# Patient Record
Sex: Female | Born: 1990 | Race: Black or African American | Hispanic: No | Marital: Married | Smoking: Never smoker
Health system: Southern US, Community
[De-identification: ages and names within clinical notes are randomized; demographics above are authoritative.]

## PROBLEM LIST (undated history)

## (undated) ENCOUNTER — Inpatient Hospital Stay (HOSPITAL_COMMUNITY): Payer: Self-pay

## (undated) DIAGNOSIS — G510 Bell's palsy: Secondary | ICD-10-CM

## (undated) DIAGNOSIS — N736 Female pelvic peritoneal adhesions (postinfective): Secondary | ICD-10-CM

## (undated) HISTORY — DX: Bell's palsy: G51.0

## (undated) HISTORY — DX: Female pelvic peritoneal adhesions (postinfective): N73.6

## (undated) HISTORY — PX: WISDOM TOOTH EXTRACTION: SHX21

---

## 2013-10-06 ENCOUNTER — Ambulatory Visit: Payer: BC Managed Care – PPO | Admitting: Family Medicine

## 2013-10-06 VITALS — BP 116/68 | HR 88 | Temp 99.1°F | Resp 20 | Ht 61.5 in | Wt 187.6 lb

## 2013-10-06 DIAGNOSIS — I889 Nonspecific lymphadenitis, unspecified: Secondary | ICD-10-CM

## 2013-10-06 DIAGNOSIS — J310 Chronic rhinitis: Secondary | ICD-10-CM

## 2013-10-06 MED ORDER — AMOXICILLIN 875 MG PO TABS: 875.0000 mg | ORAL_TABLET | Freq: Two times a day (BID) | ORAL | Status: DC

## 2013-10-06 NOTE — Patient Instructions (Signed)
Take the antibiotic one twice daily  Return if worse or not improving  Take a Claritin-D or Allegra-D one daily also to try help shrink back the swollen tissues in the nose.

## 2013-10-06 NOTE — Progress Notes (Signed)
Subjective: 22 year old lady who is here with her left side of the nose being swollen and stopped up. It's been getting this way, but today is worse. She does not smoke. She also now has a swollen gland underneath the left side of her chin.  Objective: Nose is swollen shut on the left side. She has a nose piercing on the right but nothing on the left. She has a patent airway on the right. Her neck is supple with a tender node under the left side of the chin. The throat was clear.  Assessment: Rhinitis with secondary cervical adenitis  Plan: Amoxicillin 875 twice daily Claritin-D or Allegra-D Return if not improving

## 2016-04-13 HISTORY — PX: WISDOM TOOTH EXTRACTION: SHX21

## 2016-06-14 ENCOUNTER — Encounter: Payer: Self-pay | Admitting: Obstetrics and Gynecology

## 2016-06-14 ENCOUNTER — Other Ambulatory Visit: Payer: Self-pay | Admitting: Obstetrics and Gynecology

## 2016-06-14 ENCOUNTER — Ambulatory Visit: Payer: 59 | Admitting: Obstetrics and Gynecology

## 2016-06-14 VITALS — BP 100/68 | HR 70 | Resp 16 | Ht 60.5 in | Wt 196.2 lb

## 2016-06-14 DIAGNOSIS — Z01419 Encounter for gynecological examination (general) (routine) without abnormal findings: Secondary | ICD-10-CM

## 2016-06-14 DIAGNOSIS — Z0000 Encounter for general adult medical examination without abnormal findings: Secondary | ICD-10-CM

## 2016-06-14 DIAGNOSIS — N926 Irregular menstruation, unspecified: Secondary | ICD-10-CM | POA: Diagnosis not present

## 2016-06-14 DIAGNOSIS — E669 Obesity, unspecified: Secondary | ICD-10-CM

## 2016-06-14 LAB — CBC
HEMATOCRIT: 38 % (ref 35.0–45.0)
HEMOGLOBIN: 12.7 g/dL (ref 11.7–15.5)
MCH: 27.1 pg (ref 27.0–33.0)
MCHC: 33.4 g/dL (ref 32.0–36.0)
MCV: 81 fL (ref 80.0–100.0)
MPV: 10.3 fL (ref 7.5–12.5)
Platelets: 339 10*3/uL (ref 140–400)
RBC: 4.69 MIL/uL (ref 3.80–5.10)
RDW: 14.1 % (ref 11.0–15.0)
WBC: 6.1 10*3/uL (ref 3.8–10.8)

## 2016-06-14 LAB — POCT URINALYSIS DIPSTICK
BILIRUBIN UA: NEGATIVE
GLUCOSE UA: NEGATIVE
Ketones, UA: NEGATIVE
Leukocytes, UA: NEGATIVE
Nitrite, UA: NEGATIVE
Protein, UA: NEGATIVE
RBC UA: NEGATIVE
UROBILINOGEN UA: NEGATIVE
pH, UA: 5

## 2016-06-14 LAB — POCT URINE PREGNANCY: Preg Test, Ur: NEGATIVE

## 2016-06-14 LAB — HCG, QUANTITATIVE, PREGNANCY: hCG, Beta Chain, Quant, S: <2 m[IU]/mL

## 2016-06-14 LAB — TSH: TSH: 1.19 mIU/L

## 2016-06-14 NOTE — Progress Notes (Signed)
25 y.o. G0P0000 Married [place] American female here for annual exam.    Wants to plan for healthy pregnancy.  Menses - June 23-27.  July was late by a couple of days and it was late. Thought she had symptoms of pregnancy - breast tenderness, headache and had LLQ pain for 2 weeks and then did a UPT on 06/06/16, which was negative.   Did another UPT on 06/10/16 which was negative.  Wants to loose weight in preparation for upcoming pregnancy.   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   PCP:  None  UPT:  Negative  Patient's last menstrual period was 06/06/2016 (exact date).     Period Cycle (Days): 30 Period Pattern: Regular Menstrual Flow: Moderate Menstrual Control: Maxi pad Menstrual Control Change Freq (Hours): every 2 hours for cleanliness Dysmenorrhea: (!) Mild (first day severe then mild)     Sexually active: Yes.   Preference:  Female partner. The current method of family planning is none.    Exercising: No.   Smoker:  no  Health Maintenance: Pap: ? 2015 normal per patient History of abnormal Pap:  no MMG:  n/a Colonoscopy:  n/a BMD:   n/a  Result  n/a TDaP: UNSURE.Marland Kitchen She will check.   Gardasil:   No.  Declines this.  Screening Labs:  Hb today: 12.7, Urine today: Neg   reports that she has never smoked. She has never used smokeless tobacco. She reports that she drinks about 0.6 oz of alcohol per week . She reports that she does not use drugs.  Past Medical History:  Diagnosis Date  . Bell's palsy    3-4 years ago    Past Surgical History:  Procedure Laterality Date  . WISDOM TOOTH EXTRACTION  04/2016    Current Outpatient Prescriptions  Medication Sig Dispense Refill  . OVER THE COUNTER MEDICATION Vitafusion Vitamins -- Takes 1 tablet daily     No current facility-administered medications for this visit.     Family History  Problem Relation Age of Onset  . Hypertension Father   .  Cancer Maternal Grandmother     stomach    ROS:  Pertinent items are noted in HPI.  Otherwise, a comprehensive ROS was negative.  Exam:   BP 100/68 (BP Location: Right Arm, Patient Position: Sitting, Cuff Size: Large)   Pulse 70   Resp 16   Ht 5' 0.5" (1.537 m)   Wt 196 lb 3.2 oz (89 kg)   LMP 06/06/2016 (Exact Date)   BMI 37.69 kg/m     General appearance: alert, cooperative and appears stated age Head: Normocephalic, without obvious abnormality, atraumatic Neck: no adenopathy, supple, symmetrical, trachea midline and thyroid normal to inspection and palpation Lungs: clear to auscultation bilaterally Breasts: normal appearance, no masses or tenderness, No nipple retraction or dimpling, No nipple discharge or bleeding, No axillary or supraclavicular adenopathy Heart: regular rate and rhythm Abdomen: soft, non-tender; no masses, no organomegaly Extremities: extremities normal, atraumatic, no cyanosis or edema Skin: Skin color, texture, turgor normal. No rashes or lesions Lymph nodes: Cervical, supraclavicular, and axillary nodes normal. No abnormal inguinal nodes palpated Neurologic: Grossly normal  Pelvic: External genitalia:  no lesions              Urethra:  normal appearing urethra with no masses, tenderness or lesions              Bartholins and Skenes: normal  Vagina: normal appearing vagina with normal color and discharge, no lesions              Cervix: no lesions              Pap taken: Yes.   Bimanual Exam:  Uterus:  normal size, contour, position, consistency, mobility, non-tender              Adnexa: no mass, fullness, tenderness             Chaperone was present for exam.  Assessment:   Well woman visit with normal exam. Desire for pregnancy.   Plan: Yearly mammogram recommended after age 74.  Recommended self breast exam.  Pap and HR HPV as above. Start PNV. Referral to nutrition counseling.  Discussed weight loss. Routine labs and will  check quant beta hCG due to her abnormal last period.  Discussed timed intercourse using ovulation kits. I discussed reading material for pregnancy preparation.  What to expect When...Marland KitchenMarland Kitchen series.  Follow up annually and prn.   After visit summary provided.

## 2016-06-14 NOTE — Patient Instructions (Signed)
Health Maintenance, Female Adopting a healthy lifestyle and getting preventive care can go a long way to promote health and wellness. Talk with your health care provider about what schedule of regular examinations is right for you. This is a good chance for you to check in with your provider about disease prevention and staying healthy. In between checkups, there are plenty of things you can do on your own. Experts have done a lot of research about which lifestyle changes and preventive measures are most likely to keep you healthy. Ask your health care provider for more information. WEIGHT AND DIET  Eat a healthy diet  Be sure to include plenty of vegetables, fruits, low-fat dairy products, and lean protein.  Do not eat a lot of foods high in solid fats, added sugars, or salt.  Get regular exercise. This is one of the most important things you can do for your health.  Most adults should exercise for at least 150 minutes each week. The exercise should increase your heart rate and make you sweat (moderate-intensity exercise).  Most adults should also do strengthening exercises at least twice a week. This is in addition to the moderate-intensity exercise.  Maintain a healthy weight  Body mass index (BMI) is a measurement that can be used to identify possible weight problems. It estimates body fat based on height and weight. Your health care provider can help determine your BMI and help you achieve or maintain a healthy weight.  For females 20 years of age and older:   A BMI below 18.5 is considered underweight.  A BMI of 18.5 to 24.9 is normal.  A BMI of 25 to 29.9 is considered overweight.  A BMI of 30 and above is considered obese.  Watch levels of cholesterol and blood lipids  You should start having your blood tested for lipids and cholesterol at 26 years of age, then have this test every 5 years.  You may need to have your cholesterol levels checked more often if:  Your lipid  or cholesterol levels are high.  You are older than 26 years of age.  You are at high risk for heart disease.  CANCER SCREENING   Lung Cancer  Lung cancer screening is recommended for adults 55-80 years old who are at high risk for lung cancer because of a history of smoking.  A yearly low-dose CT scan of the lungs is recommended for people who:  Currently smoke.  Have quit within the past 15 years.  Have at least a 30-pack-year history of smoking. A pack year is smoking an average of one pack of cigarettes a day for 1 year.  Yearly screening should continue until it has been 15 years since you quit.  Yearly screening should stop if you develop a health problem that would prevent you from having lung cancer treatment.  Breast Cancer  Practice breast self-awareness. This means understanding how your breasts normally appear and feel.  It also means doing regular breast self-exams. Let your health care provider know about any changes, no matter how small.  If you are in your 20s or 30s, you should have a clinical breast exam (CBE) by a health care provider every 1-3 years as part of a regular health exam.  If you are 40 or older, have a CBE every year. Also consider having a breast X-ray (mammogram) every year.  If you have a family history of breast cancer, talk to your health care provider about genetic screening.  If you   are at high risk for breast cancer, talk to your health care provider about having an MRI and a mammogram every year.  Breast cancer gene (BRCA) assessment is recommended for women who have family members with BRCA-related cancers. BRCA-related cancers include:  Breast.  Ovarian.  Tubal.  Peritoneal cancers.  Results of the assessment will determine the need for genetic counseling and BRCA1 and BRCA2 testing. Cervical Cancer Your health care provider may recommend that you be screened regularly for cancer of the pelvic organs (ovaries, uterus, and  vagina). This screening involves a pelvic examination, including checking for microscopic changes to the surface of your cervix (Pap test). You may be encouraged to have this screening done every 3 years, beginning at age 21.  For women ages 30-65, health care providers may recommend pelvic exams and Pap testing every 3 years, or they may recommend the Pap and pelvic exam, combined with testing for human papilloma virus (HPV), every 5 years. Some types of HPV increase your risk of cervical cancer. Testing for HPV may also be done on women of any age with unclear Pap test results.  Other health care providers may not recommend any screening for nonpregnant women who are considered low risk for pelvic cancer and who do not have symptoms. Ask your health care provider if a screening pelvic exam is right for you.  If you have had past treatment for cervical cancer or a condition that could lead to cancer, you need Pap tests and screening for cancer for at least 20 years after your treatment. If Pap tests have been discontinued, your risk factors (such as having a new sexual partner) need to be reassessed to determine if screening should resume. Some women have medical problems that increase the chance of getting cervical cancer. In these cases, your health care provider may recommend more frequent screening and Pap tests. Colorectal Cancer  This type of cancer can be detected and often prevented.  Routine colorectal cancer screening usually begins at 26 years of age and continues through 26 years of age.  Your health care provider may recommend screening at an earlier age if you have risk factors for colon cancer.  Your health care provider may also recommend using home test kits to check for hidden blood in the stool.  A small camera at the end of a tube can be used to examine your colon directly (sigmoidoscopy or colonoscopy). This is done to check for the earliest forms of colorectal  cancer.  Routine screening usually begins at age 50.  Direct examination of the colon should be repeated every 5-10 years through 26 years of age. However, you may need to be screened more often if early forms of precancerous polyps or small growths are found. Skin Cancer  Check your skin from head to toe regularly.  Tell your health care provider about any new moles or changes in moles, especially if there is a change in a mole's shape or color.  Also tell your health care provider if you have a mole that is larger than the size of a pencil eraser.  Always use sunscreen. Apply sunscreen liberally and repeatedly throughout the day.  Protect yourself by wearing long sleeves, pants, a wide-brimmed hat, and sunglasses whenever you are outside. HEART DISEASE, DIABETES, AND HIGH BLOOD PRESSURE   High blood pressure causes heart disease and increases the risk of stroke. High blood pressure is more likely to develop in:  People who have blood pressure in the high end   of the normal range (130-139/85-89 mm Hg).  People who are overweight or obese.  People who are African American.  If you are 38-23 years of age, have your blood pressure checked every 3-5 years. If you are 61 years of age or older, have your blood pressure checked every year. You should have your blood pressure measured twice--once when you are at a hospital or clinic, and once when you are not at a hospital or clinic. Record the average of the two measurements. To check your blood pressure when you are not at a hospital or clinic, you can use:  An automated blood pressure machine at a pharmacy.  A home blood pressure monitor.  If you are between 45 years and 39 years old, ask your health care provider if you should take aspirin to prevent strokes.  Have regular diabetes screenings. This involves taking a blood sample to check your fasting blood sugar level.  If you are at a normal weight and have a low risk for diabetes,  have this test once every three years after 26 years of age.  If you are overweight and have a high risk for diabetes, consider being tested at a younger age or more often. PREVENTING INFECTION  Hepatitis B  If you have a higher risk for hepatitis B, you should be screened for this virus. You are considered at high risk for hepatitis B if:  You were born in a country where hepatitis B is common. Ask your health care provider which countries are considered high risk.  Your parents were born in a high-risk country, and you have not been immunized against hepatitis B (hepatitis B vaccine).  You have HIV or AIDS.  You use needles to inject street drugs.  You live with someone who has hepatitis B.  You have had sex with someone who has hepatitis B.  You get hemodialysis treatment.  You take certain medicines for conditions, including cancer, organ transplantation, and autoimmune conditions. Hepatitis C  Blood testing is recommended for:  Everyone born from [birth year] through 1966.  Anyone with known risk factors for hepatitis C. Sexually transmitted infections (STIs)  You should be screened for sexually transmitted infections (STIs) including gonorrhea and chlamydia if:  You are sexually active and are younger than 26 years of age.  You are older than 26 years of age and your health care provider tells you that you are at risk for this type of infection.  Your sexual activity has changed since you were last screened and you are at an increased risk for chlamydia or gonorrhea. Ask your health care provider if you are at risk.  If you do not have HIV, but are at risk, it may be recommended that you take a prescription medicine daily to prevent HIV infection. This is called pre-exposure prophylaxis (PrEP). You are considered at risk if:  You are sexually active and do not regularly use condoms or know the HIV status of your partner(s).  You take drugs by injection.  You are sexually  active with a partner who has HIV. Talk with your health care provider about whether you are at high risk of being infected with HIV. If you choose to begin PrEP, you should first be tested for HIV. You should then be tested every 3 months for as long as you are taking PrEP.  PREGNANCY   If you are premenopausal and you may become pregnant, ask your health care provider about preconception counseling.  If you may  become pregnant, take 400 to 800 micrograms (mcg) of folic acid every day.  If you want to prevent pregnancy, talk to your health care provider about birth control (contraception). OSTEOPOROSIS AND MENOPAUSE   Osteoporosis is a disease in which the bones lose minerals and strength with aging. This can result in serious bone fractures. Your risk for osteoporosis can be identified using a bone density scan.  If you are 61 years of age or older, or if you are at risk for osteoporosis and fractures, ask your health care provider if you should be screened.  Ask your health care provider whether you should take a calcium or vitamin D supplement to lower your risk for osteoporosis.  Menopause may have certain physical symptoms and risks.  Hormone replacement therapy may reduce some of these symptoms and risks. Talk to your health care provider about whether hormone replacement therapy is right for you.  HOME CARE INSTRUCTIONS   Schedule regular health, dental, and eye exams.  Stay current with your immunizations.   Do not use any tobacco products including cigarettes, chewing tobacco, or electronic cigarettes.  If you are pregnant, do not drink alcohol.  If you are breastfeeding, limit how much and how often you drink alcohol.  Limit alcohol intake to no more than 1 drink per day for nonpregnant women. One drink equals 12 ounces of beer, 5 ounces of wine, or 1 ounces of hard liquor.  Do not use street drugs.  Do not share needles.  Ask your health care provider for help if  you need support or information about quitting drugs.  Tell your health care provider if you often feel depressed.  Tell your health care provider if you have ever been abused or do not feel safe at home.   This information is not intended to replace advice given to you by your health care provider. Make sure you discuss any questions you have with your health care provider.   Document Released: 05/15/2011 Document Revised: 11/20/2014 Document Reviewed: 10/01/2013 Elsevier Interactive Patient Education [employer].

## 2016-06-15 LAB — LIPID PANEL
Cholesterol: 136 mg/dL (ref 125–200)
HDL: 42 mg/dL — ABNORMAL LOW (ref >=46)
LDL Cholesterol: 70 mg/dL (ref <130)
Total CHOL/HDL Ratio: 3.2 Ratio (ref <=5.0)
Triglycerides: 121 mg/dL (ref <150)
VLDL: 24 mg/dL (ref <30)

## 2016-06-15 LAB — COMPREHENSIVE METABOLIC PANEL
ALBUMIN: 4 g/dL (ref 3.6–5.1)
ALT: 10 U/L (ref 6–29)
AST: 13 U/L (ref 10–30)
Alkaline Phosphatase: 56 U/L (ref 33–115)
BUN: 11 mg/dL (ref 7–25)
CHLORIDE: 102 mmol/L (ref 98–110)
CO2: 28 mmol/L (ref 20–31)
Calcium: 9.3 mg/dL (ref 8.6–10.2)
Creat: 0.77 mg/dL (ref 0.50–1.10)
Glucose, Bld: 110 mg/dL — ABNORMAL HIGH (ref 65–99)
Potassium: 3.9 mmol/L (ref 3.5–5.3)
Sodium: 140 mmol/L (ref 135–146)
TOTAL PROTEIN: 7.2 g/dL (ref 6.1–8.1)
Total Bilirubin: 0.4 mg/dL (ref 0.2–1.2)

## 2016-06-15 LAB — HEMOGLOBIN, FINGERSTICK: HEMOGLOBIN, FINGERSTICK: 12.7 g/dL (ref 12.0–16.0)

## 2016-06-15 LAB — IPS PAP TEST WITH REFLEX TO HPV

## 2016-06-16 ENCOUNTER — Ambulatory Visit: Payer: 59 | Admitting: Obstetrics and Gynecology

## 2016-06-20 LAB — HEMOGLOBIN A1C
HEMOGLOBIN A1C: 4.3 % (ref <5.7)
Mean Plasma Glucose: 77 mg/dL

## 2016-07-24 ENCOUNTER — Telehealth: Payer: Self-pay | Admitting: Obstetrics and Gynecology

## 2016-07-24 NOTE — Telephone Encounter (Signed)
Patient calling with questions about her cycle and any lab tests she may need.

## 2016-07-25 NOTE — Telephone Encounter (Signed)
Spoke with patient. Patient menstrual cycle due to start on Aug 26th. Patient took UPT on 07/12/16 and 07/22/2016, patient reports they were both negative. Cycle started 07/22/16 with light spotting. On 07/23/16 and 07/24/16, patient reports passing increased clots with little bleeding. Patient states she is having light bleeding today that is dark brown in color and is still passing clots. Reports her cycles usually last 5 days, but feels her cycle is about to end, which is not normal per patient. Patient is not on birth control. States she was experiencing left lower quadrant pain prior to her appointment with Dr. Edward JollySilva on 06/14/16. Reports that she discussed this with Dr. Edward JollySilva, but the pain had subsided at the time of her appointment. Patient reports intermittent left upper quadrant pain with the start of her menses on 07/22/16, but denies any current pain. Advised patient she will need to be seen in office for further evaluation. Patient is agreeable and is requesting an appointment after 1:30pm. Advised patient will review scheduling with nursing supervisor and will return call. Patient is agreeable.

## 2016-07-25 NOTE — Telephone Encounter (Signed)
Left message to call Kaitlyn at [phone]. 

## 2016-07-25 NOTE — Telephone Encounter (Signed)
Spoke with patient. Advised I have spoken with nursing supervisor Billie RuddySally Yeakley, RN. Offered appointment for today after 1:30 pm with Dr.Jertson as Dr.Silva is out of the office today. Patient declines. Offered appointment with NP after 1:30 pm on 9/13 or 9/14, but patient declines. Patient spoke with her employer and may be seen for a morning appointment with Dr.Silva. Appointment scheduled for 07/27/2016 at 11:30 am with Dr.Silva. Patient is agreeable to date and time. Advised if her bleeding increases to having to change her pad/tampon every hour due to bleeding through for 2 hours or develops any new symptoms she will need to be seen for immediate evaluation with our office or ER. Patient is agreeable and verbalizes understanding.  Routing to provider for final review. Patient agreeable to disposition. Will close encounter.

## 2016-07-27 ENCOUNTER — Ambulatory Visit (INDEPENDENT_AMBULATORY_CARE_PROVIDER_SITE_OTHER): Payer: 59 | Admitting: Obstetrics and Gynecology

## 2016-07-27 ENCOUNTER — Encounter: Payer: Self-pay | Admitting: Obstetrics and Gynecology

## 2016-07-27 VITALS — BP 122/70 | HR 84 | Ht 60.5 in | Wt 196.4 lb

## 2016-07-27 DIAGNOSIS — N926 Irregular menstruation, unspecified: Secondary | ICD-10-CM

## 2016-07-27 DIAGNOSIS — N979 Female infertility, unspecified: Secondary | ICD-10-CM | POA: Diagnosis not present

## 2016-07-27 LAB — HEMOGLOBIN, FINGERSTICK: Hemoglobin, fingerstick: 12.4 g/dL (ref 12.0–16.0)

## 2016-07-27 LAB — POCT URINE PREGNANCY: Preg Test, Ur: NEGATIVE

## 2016-07-27 NOTE — Progress Notes (Signed)
GYNECOLOGY  VISIT   HPI: 25 y.o.   Single  African American  female   Kristine Rivera with Patient's last menstrual period was 07/22/2016 (exact date).   here for heavy irregular bleeding with clots. Patient states this last cycles was 15 days late. At heaviest had two days of heavy clotting into the toilet. Changed pad twice an hour for freshness reasons but not due to heaviness of flow.   For her usual cycles changes pad hourly as she does not like to have blood there.  It is not full of blood.  Menses 3 late in July.   Had a negative UPT at home.  Headache.  No dizziiness or lightheadedness.  Fleeting pain in left side. Feels a knot in skin.   Used birth control the last time in May 2010.   Last time she prevented pregnancy was in early 2016.   UPT:Neg HGB: 12.4   GYNECOLOGIC HISTORY: Patient's last menstrual period was 07/22/2016 (exact date). Contraception:  none Menopausal hormone therapy:  n/a Last mammogram:  n/a Last pap smear:   06-14-16 Neg        OB History    Gravida Para Term Preterm AB Living   0 0 0 0 0 0   SAB TAB Ectopic Multiple Live Births   0 0 0 0 0         There are no active problems to display for this patient.   Past Medical History:  Diagnosis Date  . Bell's palsy    3-4 years ago    Past Surgical History:  Procedure Laterality Date  . WISDOM TOOTH EXTRACTION  04/2016    Current Outpatient Prescriptions  Medication Sig Dispense Refill  . Prenatal Vit-Fe Fumarate-FA (PRENATAL VITAMIN PO) Take 1 tablet by mouth daily.     No current facility-administered medications for this visit.      ALLERGIES: Review of patient's allergies indicates no known allergies.  Family History  Problem Relation Age of Onset  . Hypertension Father   . Cancer Maternal Grandmother     stomach    Social History   Social History  . Marital status: Single    Spouse name: N/A  . Number of children: N/A  . Years of education: N/A   Occupational  History  . Not on file.   Social History Main Topics  . Smoking status: Never Smoker  . Smokeless tobacco: Never Used  . Alcohol use 0.6 oz/week    1 Standard drinks or equivalent per week  . Drug use: No  . Sexual activity: Yes    Partners: Male    Birth control/ protection: None   Other Topics Concern  . Not on file   Social History Narrative  . No narrative on file    ROS:  Pertinent items are noted in HPI.  PHYSICAL EXAMINATION:    BP 122/70 (BP Location: Right Arm, Patient Position: Sitting, Cuff Size: Large)   Pulse 84   Ht 5' 0.5" (1.537 m)   Wt 196 lb 6.4 oz (89.1 kg)   LMP 07/22/2016 (Exact Date)   BMI 37.73 kg/m     General appearance: alert, cooperative and appears stated age   Abdomen: soft, slightly tender left mid abdomen, no definitive masses,  no organomegaly    Pelvic: External genitalia:  no lesions              Urethra:  normal appearing urethra with no masses, tenderness or lesions  Bartholins and Skenes: normal                 Vagina: normal appearing vagina with normal color and discharge, no lesions              Cervix: no lesions                Bimanual Exam:  Uterus:  normal size, contour, position, consistency, mobility, non-tender              Adnexa: no mass, fullness, tenderness           Chaperone was present for exam.  ASSESSMENT  Irregular menses.  Primary infertility.  PLAN  Discussion of irregular menses and menorrrhagia. Spouse will do SA - Zachery Conch at [employer].  Rx given to patient along with hand written Rx.  Return for progeserone on Sept 29, day 21.  Patient also has ovulation kit at home.  She was instructed in use.  [facility] at [facility].  May need pelvic ultrasound if heavy cycles persist.  Continue PNV.    An After Visit Summary was printed and given to the patient.  __25____ minutes face to face time of which over 50% was spent in counseling.

## 2016-08-02 ENCOUNTER — Encounter: Payer: 59 | Attending: Obstetrics and Gynecology | Admitting: Dietician

## 2016-08-02 DIAGNOSIS — E669 Obesity, unspecified: Secondary | ICD-10-CM | POA: Insufficient documentation

## 2016-08-02 DIAGNOSIS — Z713 Dietary counseling and surveillance: Secondary | ICD-10-CM | POA: Insufficient documentation

## 2016-08-02 NOTE — Patient Instructions (Addendum)
Check out [employer] on [address] [phone]. Try non fat plain greek yogurt to make ranch dressing or measure out 2 tablespoon of "real ranch". Aim to fill half of your plate with vegetables lunch and dinner if possible. Have about 3-4 oz of protein with meals. Try eating meals or snacks with no TV.  If you are hungry, have a snack with carbs and protein (yogurt, fruit with nuts or cheese, rice cake with peanut butter) Try using smaller plates or bowls to help with serving size. Portion out nuts (small handful).  Try putting milk in cereal bowl before cereal.  Plan to walk with husband on weekends.  Plan for one sweet treat per treat.

## 2016-08-02 NOTE — Progress Notes (Signed)
  Medical Nutrition Therapy:  Appt start time: 1605 end time:  1710.   Assessment:  Primary concerns today: Kristine Rivera is here today since trying to lose weight before getting pregnant. Weight had been going up over time (about 25 lbs in past 6 years). Has tried Herbalife on 07/23/2016 and has lost about 3 lbs since then. Feeling like she needs lunch ideas since she is getting bored and now getting back into bad habits. Started to exercise last night and pulled a muscle. Signed up for [employer] through work. Trying to "drink half of her body weight in water". Feels like she is eating too many snacks.  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 Lives with husband. Does not miss or skip meals. Was eating all meals from restaurants and loves sweets (ice cream) before two weeks ago. Feels tired after work and has a hard time cooking at night. Weekends both her and husband will cook. Husband does not want to help to cook during the week. Did meal prep for the first week of Herbalife.   Likes to do exercise with a partner and doesn't have one at this point. Feels like she eats more because it's there then out of hunger.   Vegetables she like: lettuce, green beans, carrots, broccoli, collard greens, cabbage   Preferred Learning Style:   No preference indicated   Learning Readiness:   Ready  MEDICATIONS: none   DIETARY INTAKE:  Usual eating pattern includes 3 meals and 5 snacks per day.  Avoided foods include a lot of vegetables (hasn't tried them), cantaloupe, onions, tomatoes   24-hr recall:  B ( AM): Herbalife Shake (was skipping) Snk ( AM): none L ( PM): yogurt, grilled chicken with brown rice and salad or Malawiturkey wrap with lettuce and mozzarella cheese (was doing frozen banquet meals or sandwich or leftovers) Snk ( PM): yogurt, cheese D ( PM): Herbalife Shake or 2 bowls of honey nut oat cereal with milk Snk ( PM): mixed nuts, greek yogurt, protein bars Beverages: water  Usual  physical activity: just started last night, likes to walk   Estimated energy needs: 1800 calories 200 g carbohydrates 135 g protein 50 g fat  Progress Towards Goal(s):  In progress.   Nutritional Diagnosis:  [place]-3.3 Overweight/obesity As related to hx of excessive snacking and large portion sizes.  As evidenced by BMI of 38.6.    Intervention:  Nutrition counseling provided. Plan: Check out [employer] on [address] [phone]. Try non fat plain greek yogurt to make ranch dressing or measure out 2 tablespoon of "real ranch". Aim to fill half of your plate with vegetables lunch and dinner if possible. Have about 3-4 oz of protein with meals. Try eating meals or snacks with no TV.  If you are hungry, have a snack with carbs and protein (yogurt, fruit with nuts or cheese, rice cake with peanut butter) Try using smaller plates or bowls to help with serving size. Portion out nuts (small handful).  Try putting milk in cereal bowl before cereal.  Plan to walk with husband on weekends.  Plan for one sweet treat per treat.   Teaching Method Utilized:  Visual Auditory Hands on  Handouts given during visit include:  MyPlate handout  15 g CHO Snacks  Meal Card  Barriers to learning/adherence to lifestyle change: none  Demonstrated degree of understanding via:  Teach Back   Monitoring/Evaluation:  Dietary intake, exercise, and body weight in 1 month(s).

## 2016-08-08 ENCOUNTER — Telehealth: Payer: Self-pay | Admitting: Obstetrics and Gynecology

## 2016-08-08 NOTE — Telephone Encounter (Signed)
I have reviewed the phone calls, and I need to help clarify ovulation testing for the patient. There are 2 commonly used reliable methods to detect ovulation. - Ovulation predictor kits of the urine test for the luteinizing hormone surge that occurs about 36 hours before ovulation actually occurs.  Women can do this test in the privacy of their own home.  - Progesterone measurement of the blood tests for progesterone hormone level in the blood that rises after ovulation has occurred.  This means that this is a retrospective test.  This is probably best measured about 7 days post ovulation.    Originally we calculated that we thought that her progesterone hormone would be measured on Sept. 29 based on the first day of her last period.   Now that she has ovulation predictor kit testing suggesting ovulation currently, October 2 is the better day to measure the progesterone.  All this means is that her cycles are not always 28 days long, which we already knew.

## 2016-08-08 NOTE — Telephone Encounter (Signed)
Patient calling for update on phone encounter as seen below. Advised will review with covering provider and return call. Patient agreeable.   Dr. Hyacinth MeekerMiller, please advise?  CC: Dr. Edward JollySilva

## 2016-08-08 NOTE — Telephone Encounter (Signed)
If she is correct and today is ovulation, then should have progesterone level done 7 days later.  That would be Oct 2 by my calculation.  Please confirm.  Ok to change lab date.  Will CC Dr. Edward JollySilva.

## 2016-08-08 NOTE — Telephone Encounter (Signed)
Patient has been doing the ovulation test and today is her peak. She wants to know if she will need to come in today instead of Friday for her lab.

## 2016-08-08 NOTE — Telephone Encounter (Signed)
Spoke with patient. Advised of Dr. Sabra Heck, covering provider, recommendations of having lab drawn on 08/14/16. Patient states will be too late to do the test on 08/14/16,  she reports she will not be ovulating. Patient states LMP started 07/31/16 and is using a Clear Blue Predictor Test.Patient states she will need "ovulation" lab drawn within 48 hours of peak ovulation according to package insert in predictor kit. Advised patient would place on hold and review with Dr. Sabra Heck. Placed patient on hold, spoke with Dr. Sabra Heck. Dr. Sabra Heck recommended 08/14/16 lab draw, but states patient has the option to have lab drawn today, refrigerate and review with Dr. Quincy Simmonds 08/09/16 for confirmation, so that patient does not miss window of opportunity. Returned to patient, advised patient of Dr. Ammie Ferrier recommendations. Patient states at this time her LMP started 07/22/16. Patient states she would like to wait and have Dr. Quincy Simmonds review notes for recommendations. I advised would send to Dr. Sabra Heck and Dr. Quincy Simmonds and follow-up with patient in the morning, 08/09/16.   Dr. Quincy Simmonds, would you like patient to come in earlier for lab appointment or come in earlier? Patient currently scheduled for Progesterone Lab draw 08/11/16.   CC: Dr. Sabra Heck

## 2016-08-08 NOTE — Telephone Encounter (Signed)
Spoke with patient. Patient states per last OV 07/27/16, patient reports taking ovulation test today. Patient reports today is her peak. Patient sates scheduled for Labs 08/11/16 for ovulation and progesterone. Patient would like to know if she should come today instead of Friday due to ovulation test reading. Advised patient would review with Dr. Edward JollySilva and return call with recommendations. Patient is agreeable.   Dr. Edward JollySilva, please advise?

## 2016-08-09 NOTE — Telephone Encounter (Signed)
Spoke with patient. Advised as seen below per Dr. Edward JollySilva. Patient scheduled for Lab visit 08/14/16 at 3:30pm. Patient verbalizes understanding and is agreeable.   Routing to provider for final review. Patient is agreeable to disposition. Will close encounter.

## 2016-08-11 ENCOUNTER — Other Ambulatory Visit: Payer: 59

## 2016-08-14 ENCOUNTER — Other Ambulatory Visit (INDEPENDENT_AMBULATORY_CARE_PROVIDER_SITE_OTHER): Payer: 59

## 2016-08-14 DIAGNOSIS — N926 Irregular menstruation, unspecified: Secondary | ICD-10-CM

## 2016-08-15 LAB — PROGESTERONE: PROGESTERONE: 19.3 ng/mL

## 2016-08-22 ENCOUNTER — Telehealth: Payer: Self-pay | Admitting: Obstetrics and Gynecology

## 2016-08-22 NOTE — Telephone Encounter (Signed)
Spoke with patient. Patient states she needs letter for work with name, date and time of Lab appt, sent through MyChart, no signature required. Advised will send letter, call for any additional questions.  Routing to provider for final review. Patient is agreeable to disposition. Will close encounter.

## 2016-08-22 NOTE — Telephone Encounter (Signed)
Patient is requesting a letter that she was seen on 08/14/16 for labs.  Patient is wanting this sent through her MyChart

## 2016-08-22 NOTE — Telephone Encounter (Signed)
Thank you for sending this letter! 

## 2016-09-01 ENCOUNTER — Ambulatory Visit (HOSPITAL_COMMUNITY)
Admission: RE | Admit: 2016-09-01 | Discharge: 2016-09-01 | Disposition: A | Discharge status: Home / Self Care | Payer: 59 | Source: Ambulatory Visit | Attending: Obstetrics and Gynecology | Admitting: Obstetrics and Gynecology

## 2016-09-01 DIAGNOSIS — N979 Female infertility, unspecified: Secondary | ICD-10-CM

## 2016-09-01 MED ORDER — IOPAMIDOL (ISOVUE-300) INJECTION 61%
30.0000 mL | Freq: Once | INTRAVENOUS | Status: AC | PRN
  Administered 2016-09-01: 10 mL

## 2016-09-04 ENCOUNTER — Encounter: Payer: Self-pay | Admitting: Obstetrics and Gynecology

## 2016-09-06 ENCOUNTER — Encounter: Payer: 59 | Attending: Obstetrics and Gynecology | Admitting: Dietician

## 2016-09-06 DIAGNOSIS — E669 Obesity, unspecified: Secondary | ICD-10-CM | POA: Diagnosis not present

## 2016-09-06 DIAGNOSIS — Z713 Dietary counseling and surveillance: Secondary | ICD-10-CM | POA: Insufficient documentation

## 2016-09-06 NOTE — Patient Instructions (Addendum)
Try non fat plain greek yogurt to make ranch dressing or measure out 2 tablespoon of "real ranch". Have about 3-4 oz of protein with meals (salmon/fish, chicken, Malawiturkey, eggs, beans). Try eating meals or snacks with no TV.  Try using smaller plates or bowls to help with serving size. Portion out nuts (small handful).  Try putting milk in cereal bowl before cereal.  Plan for one sweet treat per week. Continue exercising 2-3 x week.

## 2016-09-06 NOTE — Progress Notes (Signed)
  Medical Nutrition Therapy:  Appt start time: 1605 end time:  1630.   Assessment:  Primary concerns today: Kristine Rivera is here today since trying to lose weight before getting pregnant. Returns with a 6 lb weight loss in past 4 weeks. Exercising 2 x week. Knee has been hurting a little bit.   Overall has been eating more salad. Has no sweets at home anymore.  Vegetables she like: lettuce, green beans, carrots, broccoli, collard greens, cabbage   Preferred Learning Style:   No preference indicated   Learning Readiness:   Ready  MEDICATIONS: none   DIETARY INTAKE:  Usual eating pattern includes 3 meals and 5 snacks per day.  Avoided foods include a lot of vegetables (hasn't tried them), cantaloupe, onions, tomatoes   24-hr recall:  B ( AM): Herbalife Shake  Snk ( AM): none or rice cake L ( PM): ribs and vegetables with pasta side or salad and yogurt or Malawiturkey wrap with lettuce and mozzarella cheese and sometimes with yogurt Snk ( PM): yogurt, cheese D ( PM): skipping or pizza  Snk ( PM): none Beverages: water, soda rarely  Usual physical activity: running/mixed cardio 2 x week for 60 minutes  Estimated energy needs: 1800 calories 200 g carbohydrates 135 g protein 50 g fat  Progress Towards Goal(s):  In progress.   Nutritional Diagnosis:  [place]-3.3 Overweight/obesity As related to hx of excessive snacking and large portion sizes.  As evidenced by BMI of 38.6.    Intervention:  Nutrition counseling provided. Plan: Try non fat plain greek yogurt to make ranch dressing or measure out 2 tablespoon of "real ranch". Have about 3-4 oz of protein with meals (salmon/fish, chicken, Malawiturkey, eggs, beans). Try eating meals or snacks with no TV.  Try using smaller plates or bowls to help with serving size. Portion out nuts (small handful).  Try putting milk in cereal bowl before cereal.  Plan for one sweet treat per week. Continue exercising 2-3 x week.   Teaching Method Utilized:   Visual Auditory Hands on  Handouts given during visit include:  none  Barriers to learning/adherence to lifestyle change: none  Demonstrated degree of understanding via:  Teach Back   Monitoring/Evaluation:  Dietary intake, exercise, and body weight in 1 month(s).

## 2016-09-19 ENCOUNTER — Telehealth: Payer: Self-pay | Admitting: Obstetrics and Gynecology

## 2016-09-19 NOTE — Telephone Encounter (Signed)
Order to Dr.Silva for review and signature before fax. 

## 2016-09-19 NOTE — Telephone Encounter (Signed)
I am out of the office, and I can sign it tomorrow if you leave it on my desk.

## 2016-09-19 NOTE — Telephone Encounter (Signed)
Patient says spouse is scheduled at [facility] on Friday at 10:00 and they are needing an order faxed for his visit. Spouse name is Kristine Rivera and his dob is 12/27/1984.

## 2016-09-20 NOTE — Telephone Encounter (Signed)
Signed order faxed with cover sheet and confirmation to [facility] at [phone]. Left detailed message at number provided [phone], okay per ROI. Advised order has been faxed for appointment scheduled on 09/22/2016. Advised to return call with any further questions or concerns.  Routing to provider for final review. Patient agreeable to disposition. Will close encounter.

## 2016-09-28 ENCOUNTER — Telehealth: Payer: Self-pay | Admitting: Obstetrics and Gynecology

## 2016-09-28 NOTE — Telephone Encounter (Signed)
Please inform the patient that her partners semen analysis shows low motility and low normal morphology. Dr April MansonYalcinkaya recommends 1000 mg of vit C daily, 400 units of vit E daily, 400 mcg of folic acid daily and 50 mg of zinc daily.  Please set them up with Dr April MansonYalcinkaya for a consultation. The report is being scanned.   Cc: Dr Edward JollySilva

## 2016-09-29 NOTE — Telephone Encounter (Signed)
Patient is returning a call to Jill. °

## 2016-09-29 NOTE — Telephone Encounter (Signed)
Spoke with patient, advised as seen below. Patient verbalizes understanding and is agreeable.  Routing to provider for final review. Patient is agreeable to disposition. Will close encounter.  Cc: Dr. Edward JollySilva

## 2016-09-29 NOTE — Telephone Encounter (Signed)
Left message to call Sigrid Schwebach at [phone].  

## 2016-10-12 ENCOUNTER — Encounter: Payer: 59 | Attending: Obstetrics and Gynecology | Admitting: Dietician

## 2016-10-12 DIAGNOSIS — Z6835 Body mass index (BMI) 35.0-35.9, adult: Secondary | ICD-10-CM

## 2016-10-12 DIAGNOSIS — Z713 Dietary counseling and surveillance: Secondary | ICD-10-CM | POA: Insufficient documentation

## 2016-10-12 DIAGNOSIS — E669 Obesity, unspecified: Secondary | ICD-10-CM

## 2016-10-12 NOTE — Patient Instructions (Addendum)
Get back to meal prepping.  Try non fat plain greek yogurt to make ranch dressing or measure out 2 tablespoon of "real ranch". Have about 3-4 oz of lean protein with meals (salmon/fish, chicken, Malawiturkey, eggs, beans). Consider eating meals or snacks with no TV.  Drink more water and cut back on soda. Try using smaller plates or bowls to help with serving size. Get back to one sweet treat per week. Consider taking a Vitamin D supplement. Continue exercising 1 x week.

## 2016-10-12 NOTE — Progress Notes (Signed)
  Medical Nutrition Therapy:  Appt start time: 1715 end time:  1745   Assessment:  Primary concerns today: Kristine Rivera is here today since she is trying to lose weight. Returns with no weight change. Has another appointment with doctor about trying to pregnancy.  Tried adding milk to the bowl for cereal first and it is helping her eat less. Has not been exercise recently since she is doing a mime practice (Monday and Thursday).  Overall has been eating more salad. Has oatmeal pies and cookies at home.   Vegetables she like: lettuce, green beans, carrots, broccoli, collard greens, cabbage   Preferred Learning Style:   No preference indicated   Learning Readiness:   Ready  MEDICATIONS: none   DIETARY INTAKE:  Usual eating pattern includes 3 meals and 5 snacks per day.  Avoided foods include a lot of vegetables (hasn't tried them), cantaloupe, onions, tomatoes   24-hr recall:  B ( AM): Herbalife Shake  Snk ( AM): none  L ( PM): pizza or Malawiturkey wrap with lettuce and mozzarella cheese and sometimes with yogurt Snk ( PM): yogurt, cheese or cookies/oatmeal pie D ( PM): hamburger helper or thanksgiving food or cereal  Snk ( PM): none  Beverages: water, soda rarely  Usual physical activity: none recently  Estimated energy needs: 1800 calories 200 g carbohydrates 135 g protein 50 g fat  Progress Towards Goal(s):  In progress.   Nutritional Diagnosis:  [place]-3.3 Overweight/obesity As related to hx of excessive snacking and large portion sizes.  As evidenced by BMI of 38.6.    Intervention:  Nutrition counseling provided. Plan: Get back to meal prepping.  Try non fat plain greek yogurt to make ranch dressing or measure out 2 tablespoon of "real ranch". Have about 3-4 oz of lean protein with meals (salmon/fish, chicken, Malawiturkey, eggs, beans). Consider eating meals or snacks with no TV.  Drink more water and cut back on soda. Try using smaller plates or bowls to help with serving  size. Get back to one sweet treat per week. Consider taking a Vitamin D supplement. Continue exercising 1 x week.   Teaching Method Utilized:  Visual Auditory Hands on  Handouts given during visit include:  none  Barriers to learning/adherence to lifestyle change: none  Demonstrated degree of understanding via:  Teach Back   Monitoring/Evaluation:  Dietary intake, exercise, and body weight in 2 month(s).

## 2016-10-18 ENCOUNTER — Encounter: Payer: Self-pay | Admitting: Obstetrics and Gynecology

## 2016-10-18 ENCOUNTER — Ambulatory Visit (INDEPENDENT_AMBULATORY_CARE_PROVIDER_SITE_OTHER): Payer: 59 | Admitting: Obstetrics and Gynecology

## 2016-10-18 VITALS — BP 130/88 | HR 68 | Resp 16 | Ht 59.75 in | Wt 194.0 lb

## 2016-10-18 DIAGNOSIS — R030 Elevated blood-pressure reading, without diagnosis of hypertension: Secondary | ICD-10-CM

## 2016-10-18 DIAGNOSIS — R51 Headache: Secondary | ICD-10-CM | POA: Diagnosis not present

## 2016-10-18 DIAGNOSIS — N979 Female infertility, unspecified: Secondary | ICD-10-CM | POA: Diagnosis not present

## 2016-10-18 DIAGNOSIS — R519 Headache, unspecified: Secondary | ICD-10-CM

## 2016-10-18 MED ORDER — IBUPROFEN 800 MG PO TABS: 800.0000 mg | ORAL_TABLET | Freq: Three times a day (TID) | ORAL | 0 refills | Status: DC | PRN

## 2016-10-18 NOTE — Progress Notes (Signed)
[facility] evaluation of headaches and elevated B/P. Patient scheduled for 10/24/16 at 4pm. Provided patient with address and contact information. Patient is agreeable to date and time.   Spoke with Maggie at Dr. Lyndal RainbowYalcinkaya's office -request referral for faxed to ATTN: Maggie at [phone]. 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 Also include on referral spouse will need sign language interpreter, spouse is deaf. Maggie to return call to patient to schedule appt. Advised patient as seen above, patient is agreeable.

## 2016-10-18 NOTE — Progress Notes (Signed)
GYNECOLOGY  VISIT   HPI: 25 y.o.   Single  African American  female   [id number] with Patient's last menstrual period was 09/28/2016.   here for follow up. Patient c/o headaches for past 2 weeks. Takes Advil with no relief. Headaches are felt on L side near temple.      Can switch from side to side, left and right.  Headaches waking her up.  Has taken Advil and aspirin.  No nausea, vomiting, or sensitivity to light.  No new exposures.  Sleep does help to improve headache.  No visual changes.   Not drinking coffee or tea.  No dietary changes.   Menses are regular.   In process of infertility work up. Semen analysis showed low motility and low normal morphology.  Partner has not scheduled an appointment with Dr. April MansonYalcinkaya.   She is ovulatory by progesterone.  HSG showed some possible left peritubal adhesions. Did have small amount of contrast spill on left side. Right tube patent. Normal uterine cavity.  GYNECOLOGIC HISTORY: Patient's last menstrual period was 09/28/2016. Contraception:  none Menopausal hormone therapy:  n/a Last mammogram:  n/a Last pap smear: 06/14/16 wnl        OB History    Gravida Para Term Preterm AB Living   0 0 0 0 0 0   SAB TAB Ectopic Multiple Live Births   0 0 0 0 0         There are no active problems to display for this patient.   Past Medical History:  Diagnosis Date  . Adhesions involving left fallopian tube    adhesions suggested by hysterosalpingogram Oct. 2017  . Bell's palsy    3-4 years ago    Past Surgical History:  Procedure Laterality Date  . WISDOM TOOTH EXTRACTION  04/2016    Current Outpatient Prescriptions  Medication Sig Dispense Refill  . Prenatal Vit-Fe Fumarate-FA (PRENATAL VITAMIN PO) Take 1 tablet by mouth daily.     No current facility-administered medications for this visit.      ALLERGIES: Patient has no known allergies.  Family History  Problem Relation Age of Onset  . Hypertension Father   . Cancer  Maternal Grandmother     stomach    Social History   Social History  . Marital status: Single    Spouse name: N/A  . Number of children: N/A  . Years of education: N/A   Occupational History  . Not on file.   Social History Main Topics  . Smoking status: Never Smoker  . Smokeless tobacco: Never Used  . Alcohol use 0.6 oz/week    1 Standard drinks or equivalent per week  . Drug use: No  . Sexual activity: Yes    Partners: Male    Birth control/ protection: None   Other Topics Concern  . Not on file   Social History Narrative  . No narrative on file    ROS:  Pertinent items are noted in HPI.  PHYSICAL EXAMINATION:    BP 130/88 (BP Location: Right Arm, Patient Position: Sitting, Cuff Size: Normal)   Pulse 68   Resp 16   Ht 4' 11.75" (1.518 m)   Wt 194 lb (88 kg)   LMP 09/28/2016   BMI 38.21 kg/m     General appearance: alert, cooperative and appears stated age   ASSESSMENT  Headaches.  Elevated blood pressure.  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  Potential left tubal adhesions.    PLAN  Motrin 800 mg po q 8 hour prn pain.  Refer to PCP for HAs and elevated blood pressure.  Discussion of fertility evaluation and work up to date. Discussion potential risk for ectopic pregnancy and need for early care in pregnancy to follow hCGs. She indicates understanding of this. Follow up with Dr. April MansonYalcinkaya.  We will assist with scheduling.  An After Visit Summary was printed and given to the patient.  __25____ minutes face to face time of which over 50% was spent in counseling.

## 2016-10-19 ENCOUNTER — Telehealth: Payer: Self-pay | Admitting: *Deleted

## 2016-10-19 ENCOUNTER — Encounter: Payer: Self-pay | Admitting: Obstetrics and Gynecology

## 2016-10-19 NOTE — Telephone Encounter (Signed)
Left message to call Niki Payment at [phone].  

## 2016-10-19 NOTE — Telephone Encounter (Signed)
Spoke with patient. Patient provided Spouse name: Astrid Draftsllen Ambrosia and DOB 12/27/1984 for referral for for Dr. April MansonYalcinkaya. Advised patient Will fax form, expect call from Dr. Lyndal RainbowYalcinkaya's office for scheduling. Patient verbalizes understanding and is agreeable.  Routing to provider for final review. Patient is agreeable to disposition. Will close encounter.

## 2016-11-15 ENCOUNTER — Inpatient Hospital Stay (HOSPITAL_COMMUNITY): Admission: AD | Admit: 2016-11-15 | Payer: 59 | Source: Ambulatory Visit | Admitting: Obstetrics and Gynecology

## 2016-11-20 ENCOUNTER — Encounter: Payer: 59 | Attending: Obstetrics and Gynecology | Admitting: Dietician

## 2016-11-20 DIAGNOSIS — E669 Obesity, unspecified: Secondary | ICD-10-CM | POA: Insufficient documentation

## 2016-11-20 DIAGNOSIS — Z6837 Body mass index (BMI) 37.0-37.9, adult: Secondary | ICD-10-CM

## 2016-11-20 DIAGNOSIS — Z713 Dietary counseling and surveillance: Secondary | ICD-10-CM | POA: Insufficient documentation

## 2016-11-20 NOTE — Patient Instructions (Addendum)
Try non fat plain greek yogurt to make ranch dressing or measure out 2 tablespoon of "real ranch". Consider eating meals or snacks with no TV.  Consider taking a Vitamin D supplement (at least 1000 Iu). Have snacks with protein and carb if you are hungry.  Plan to exercise 2 x week (consider adding exercise on weekends). Try cutting back on breakfast on Saturdays.  Try [employer] Sprouts (or other vegetables) - cut in half, add olive oil, salt and pepper and roast in oven on 400 for 30 minutes (move them around on tray 30 minutes). You want them brown and crispy.

## 2016-11-20 NOTE — Progress Notes (Signed)
  Medical Nutrition Therapy:  Appt start time: 1615 end time:  1645   Assessment:  Primary concerns today: Erie NoeVanessa is here today since she is trying to lose weight. Returns with 1 lb weight loss. Started green smoothies last week with protein and planning to do that for 30 days. Drinking more water  Vegetables she like: lettuce, green beans, carrots, broccoli, collard greens, cabbage   Preferred Learning Style:   No preference indicated   Learning Readiness:   Ready  MEDICATIONS: none   DIETARY INTAKE:  Usual eating pattern includes 3 meals and 5 snacks per day.  Avoided foods include a lot of vegetables (hasn't tried them), cantaloupe, onions, tomatoes, boiled eggs   24-hr recall:  B ( AM): Herbalife Shake/green smoothie or 2 pieces of toast, eggs, oatmeal, bacon Snk ( AM): none  L ( PM): chicken, beans, and vegetables or salmon and peas Snk ( PM): smart pop or pretzels or some candies D ( PM): green smoothie or restaurant meals on weekens Snk ( PM): none  Beverages: water, soda rarely (ginger ale)  Usual physical activity: 3 x week last week  Estimated energy needs: 1800 calories 200 g carbohydrates 135 g protein 50 g fat  Progress Towards Goal(s):  In progress.   Nutritional Diagnosis:  [place]-3.3 Overweight/obesity As related to hx of excessive snacking and large portion sizes.  As evidenced by BMI of 38.6.    Intervention:  Nutrition counseling provided. Plan: Try non fat plain greek yogurt to make ranch dressing or measure out 2 tablespoon of "real ranch". Consider eating meals or snacks with no TV.  Consider taking a Vitamin D supplement (at least 1000 Iu). Have snacks with protein and carb if you are hungry.  Plan to exercise 2 x week (consider adding exercise on weekends). Try cutting back on breakfast on Saturdays.  Try [employer]ussels Sprouts (or other vegetables) - cut in half, add olive oil, salt and pepper and roast in oven on 400 for 30 minutes (move them  around on tray 30 minutes). You want them brown and crispy.   Teaching Method Utilized:  Visual Auditory Hands on  Handouts given during visit include:  none  Barriers to learning/adherence to lifestyle change: none  Demonstrated degree of understanding via:  Teach Back   Monitoring/Evaluation:  Dietary intake, exercise, and body weight prn.

## 2016-12-06 ENCOUNTER — Telehealth: Payer: Self-pay | Admitting: Obstetrics and Gynecology

## 2016-12-06 NOTE — Telephone Encounter (Signed)
Spoke with patient. Patient states that her LMP was 10/28/2016-11/01/2016. Patient is on cycle day 40 and has not started her menses. Patient took a UPT on 12/03/2016 and 12/06/2016 both were negative. Reports having mild cramping like she is going to start her menses and sore nipples bilaterally. Patient has been referred to Dr.Yalcinkaya and has an appointment for her and her husband on 12/19/2016. States this is the earliest available appointment. Patient is very concerned and requests an appointment with Dr.Silva. "I would feel better coming in to see her to see what is going on. I want lab work and maybe an ultrasound." Offered appointment today and tomorrow, but patient declines due to work schedule. Appointment scheduled for 12/08/2016 at 2:30 pm with Dr.Silva. Advised any lab work that is needed can be done at that appointment and if an ultrasound if warranted we will help her to schedule. Patient is agreeable.  Dr.Silva, do you agree with recommendations?

## 2016-12-06 NOTE — Telephone Encounter (Signed)
Left detailed message for patient. At number provided [phone], okay per ROI. Advised of message regarding evaluation at [facility] as seen below from Dr.Silva if she is having significant arm pain/tightness. Patient is scheduled for an appointment already to see Dr.Silva on 12/08/2016 at 2:30 pm. Advised if she would like to move this appointment forward to tomorrow to contact the office.

## 2016-12-06 NOTE — Telephone Encounter (Signed)
Left message to call Sreshta Cressler at [phone]. 

## 2016-12-06 NOTE — Telephone Encounter (Signed)
Patient is trying to get pregnant and is on day 40 and no cycle. She has taken two pregnancy test and both was negative.

## 2016-12-06 NOTE — Telephone Encounter (Signed)
Returned call to patient as patient called back after we spoke. Patient states that she called to see if she could be seen at 3:30 pm today, but has changed her mind as she is unable to get to the office by 3:30 pm. States she is going to [facility] to check her BP to make sure it is stable. "Feels her arm is tight." Denies any swelling, warmth or redness in her arm. Denies any dizziness, headaches, or vision changes. Advised patient if BP is elevated, develops any new symptoms will need to be seen at the [facility] for evaluation. Patient is agreeable.  Routing to Dr.Silva to review.

## 2016-12-06 NOTE — Telephone Encounter (Signed)
If patient is having significant arm pain/tightness, I would have her go to [facility] and not to [facility]. If she would like an appointment this week, I can evaluate her amenorrhea and we can do blood pregnancy testing.

## 2016-12-06 NOTE — Telephone Encounter (Signed)
Patient returning your call.

## 2016-12-07 NOTE — Telephone Encounter (Signed)
I agree with your recommendations.  You may close the encounter.  

## 2016-12-07 NOTE — Telephone Encounter (Signed)
Spoke with patient. Patient states her cycle started today at 10:45 am. Cycle is light at this time. Patient took her BP yesterday which was 106/74 and heart rate was 74. States her arm stopped feeling "tight" around an hour after it began. Denies any pain or tightness in her arm at this time. Denies any other symptoms. Aware if this returns will need to seek care at a local urgent care, PCP, or ER. Patient is agreeable. Appointment for tomorrow 12/08/2016 cancelled per patient request. Aware to keep evaluation with Dr.Yalcinkaya as scheduled for 12/19/2016.  Dr.Silva, do you agree with recommendations?

## 2016-12-07 NOTE — Telephone Encounter (Signed)
Patient started her period this morning and calling to speak with nurse.

## 2016-12-08 ENCOUNTER — Ambulatory Visit: Payer: 59 | Admitting: Obstetrics and Gynecology

## 2016-12-20 ENCOUNTER — Telehealth: Payer: Self-pay | Admitting: *Deleted

## 2016-12-20 NOTE — Telephone Encounter (Addendum)
Pt left message stating that [facility] wants a [facility] of her HSG test. Per chart review, pt receives care @ [facility] with Dr. Edward JollySilva and did have HSG on 09/01/16 @ [facility]. I returned pt's call and heard message stating that the mailbox is full and cannot accept any messages. Pt will need to call the Health Information Management Dept for [facility] @ [phone] to obtain this information.   2/7  1640  Called pt and she stated that someone has already called her and told her where to pick up the CD.

## 2017-03-12 ENCOUNTER — Encounter: Payer: Self-pay | Admitting: Obstetrics and Gynecology

## 2017-03-12 ENCOUNTER — Telehealth: Payer: Self-pay | Admitting: Obstetrics and Gynecology

## 2017-03-12 ENCOUNTER — Ambulatory Visit (INDEPENDENT_AMBULATORY_CARE_PROVIDER_SITE_OTHER): Payer: 59 | Admitting: Obstetrics and Gynecology

## 2017-03-12 VITALS — BP 112/74 | HR 100 | Ht 59.75 in | Wt 198.0 lb

## 2017-03-12 DIAGNOSIS — N912 Amenorrhea, unspecified: Secondary | ICD-10-CM

## 2017-03-12 DIAGNOSIS — J069 Acute upper respiratory infection, unspecified: Secondary | ICD-10-CM | POA: Diagnosis not present

## 2017-03-12 LAB — POCT URINE PREGNANCY: PREG TEST UR: POSITIVE — AB

## 2017-03-12 NOTE — Telephone Encounter (Signed)
Spoke with patient. Patient states she took a UPT and it was positive. Patient has a cold and is going to seek evaluation with Urgent Care. Patient is scheduled for pregnancy confirmation on 03/19/2017 with Dr.Silva. Patient denies any bleeding or pain. Advised if she has any bleeding or pain that is one sided she will need to be seen for immediate evaluation. Patient is agreeable.  Routing to provider for final review. Patient agreeable to disposition. Will close encounter.

## 2017-03-12 NOTE — Telephone Encounter (Signed)
Patient wants to confirm pregnancy. She did take a test and it was positive. She also has a very bad cold. Patient states she will probably go to urgent care for her cold. Patient states Dr. Edward Jolly told her to call if she has a positive test to make sure it is not ectopic.

## 2017-03-12 NOTE — Telephone Encounter (Signed)
Call to patient. Advised of message as seen below from Dr.Silva. Patient is going to speak with employer and return call regarding scheduling.

## 2017-03-12 NOTE — Progress Notes (Signed)
GYNECOLOGY  VISIT   HPI: 26 y.o.   Single  African American  female   G0P0000 with Patient's last menstrual period was 02/08/2017 (exact date).   here for  Pregnancy confirmation  UPT positive on 03/08/17 at home.   Some dry cough.  Not sleeping due to dry cough.  No fever, shakes or chills. Breathing well.  No diarrhea.  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  Had consultation with Dr. April Manson and she had an elevated prolactin level of about 50.  Treated for 6 weeks with cabergoline and then prolactin level came down just before her March menses.   UPT positive here.   GYNECOLOGIC HISTORY: Patient's last menstrual period was 02/08/2017 (exact date). Contraception:  none Menopausal hormone therapy:  n/a Last mammogram:  n/a Last pap smear:   06/14/16 WNL        OB History    Gravida Para Term Preterm AB Living   1 0 0 0 0 0   SAB TAB Ectopic Multiple Live Births   0 0 0 0 0         There are no active problems to display for this patient.   Past Medical History:  Diagnosis Date  . Adhesions involving left fallopian tube    adhesions suggested by hysterosalpingogram Oct. 2017  . Bell's palsy    3-4 years ago    Past Surgical History:  Procedure Laterality Date  . WISDOM TOOTH EXTRACTION  04/2016    Current Outpatient Prescriptions  Medication Sig Dispense Refill  . Prenatal Vit-Fe Fumarate-FA (PRENATAL VITAMIN PO) Take 1 tablet by mouth daily.     No current facility-administered medications for this visit.      ALLERGIES: Patient has no known allergies.  Family History  Problem Relation Age of Onset  . Hypertension Father   . Cancer Maternal Grandmother     stomach    Social History   Social History  . Marital status: Single    Spouse name: N/A  . Number of children: N/A  . Years of education: N/A   Occupational History  . Not on file.   Social History Main Topics  . Smoking status: Never Smoker  . Smokeless tobacco:  Never Used  . Alcohol use 0.6 oz/week    1 Standard drinks or equivalent per week  . Drug use: No  . Sexual activity: Yes    Partners: Male    Birth control/ protection: None   Other Topics Concern  . Not on file   Social History Narrative  . No narrative on file    ROS:  Pertinent items are noted in HPI.  PHYSICAL EXAMINATION:    BP 112/74 (BP Location: Right Arm, Patient Position: Sitting, Cuff Size: Large)   Pulse 100   Ht 4' 11.75" (1.518 m)   Wt 198 lb (89.8 kg)   LMP 02/08/2017 (Exact Date)   BMI 38.99 kg/m     General appearance: alert, cooperative and appears stated age   Lungs:  CTA bilaterally.  Cor:  S1S2 tachy.   ASSESSMENT  Amenorrhea.  Early pregnancy.  Hx infertility and adhesions around left fallopian tube.  URI.  PLAN  On PNV.  Discussed early pregnancy and possible risk of ectopic pregnancy.  Pelvic rest.  Reviewed signs and symptoms of ectopic.  Check quant hCG today and in 2 days.  Return for viability ultrasound after quants confirm appropriate level.  Robitussion DM and Claritin if needed.  I recommended avoidance of  these meds if not needed. Hydrate well.  Return for worsening respiratory symptoms.    An After Visit Summary was printed and given to the patient.  _15_____ minutes face to face time of which over 50% was spent in counseling.

## 2017-03-12 NOTE — Telephone Encounter (Signed)
Patient is scheduled for OV today with Dr.Silva at 2:30pm.  Routing to provider as FYI. Encounter previously closed.

## 2017-03-12 NOTE — Telephone Encounter (Signed)
Please have patient come in sooner for pregnancy confirmation with me.  She needs a quant beta hCG done/ I am happy to see her this afternoon for this.

## 2017-03-13 LAB — HCG, QUANTITATIVE, PREGNANCY: hCG, Beta Chain, Quant, S: 2412.9 m[IU]/mL — ABNORMAL HIGH

## 2017-03-14 ENCOUNTER — Other Ambulatory Visit: Payer: 59

## 2017-03-14 ENCOUNTER — Other Ambulatory Visit: Payer: Self-pay | Admitting: *Deleted

## 2017-03-14 DIAGNOSIS — N912 Amenorrhea, unspecified: Secondary | ICD-10-CM

## 2017-03-14 DIAGNOSIS — Z3201 Encounter for pregnancy test, result positive: Secondary | ICD-10-CM

## 2017-03-14 NOTE — Addendum Note (Signed)
Addended by: Luisa Dago on: 03/14/2017 04:54 PM   Modules accepted: Orders

## 2017-03-16 ENCOUNTER — Ambulatory Visit: Payer: 59 | Admitting: Obstetrics and Gynecology

## 2017-03-19 ENCOUNTER — Ambulatory Visit: Payer: 59 | Admitting: Obstetrics and Gynecology

## 2017-03-22 ENCOUNTER — Ambulatory Visit (INDEPENDENT_AMBULATORY_CARE_PROVIDER_SITE_OTHER): Payer: 59 | Admitting: Obstetrics and Gynecology

## 2017-03-22 ENCOUNTER — Ambulatory Visit (INDEPENDENT_AMBULATORY_CARE_PROVIDER_SITE_OTHER): Payer: 59

## 2017-03-22 ENCOUNTER — Encounter: Payer: Self-pay | Admitting: Obstetrics and Gynecology

## 2017-03-22 VITALS — BP 110/64 | HR 60 | Ht 59.75 in | Wt 196.0 lb

## 2017-03-22 DIAGNOSIS — N912 Amenorrhea, unspecified: Secondary | ICD-10-CM | POA: Diagnosis not present

## 2017-03-22 DIAGNOSIS — Z3201 Encounter for pregnancy test, result positive: Secondary | ICD-10-CM

## 2017-03-22 DIAGNOSIS — Z3490 Encounter for supervision of normal pregnancy, unspecified, unspecified trimester: Secondary | ICD-10-CM

## 2017-03-22 NOTE — Progress Notes (Signed)
Patient ID: Kristine Rivera, female   DOB: 07/27/1991, 26 y.o.   MRN: [id number] GYNECOLOGY  VISIT   HPI: 26 y.o.   Married  [place]   G1P0000 with Patient's last menstrual period was 02/08/2017 (exact date).   here for viability ultrasound.    Still with dry cough.  No fevers.  Feeling better.  No PCP.  GYNECOLOGIC HISTORY: Patient's last menstrual period was 02/08/2017 (exact date). Contraception:  none Menopausal hormone therapy:  none Last mammogram:  n/a Last pap smear: 06-14-16 Neg        OB History    Gravida Para Term Preterm AB Living   1 0 0 0 0 0   SAB TAB Ectopic Multiple Live Births   0 0 0 0 0         There are no active problems to display for this patient.   Past Medical History:  Diagnosis Date  . Adhesions involving left fallopian tube    adhesions suggested by hysterosalpingogram Oct. 2017  . Bell's palsy    3-4 years ago    Past Surgical History:  Procedure Laterality Date  . WISDOM TOOTH EXTRACTION  04/2016    Current Outpatient Prescriptions  Medication Sig Dispense Refill  . Prenatal Vit-Fe Fumarate-FA (PRENATAL VITAMIN PO) Take 1 tablet by mouth daily.     No current facility-administered medications for this visit.     Has DHA.   ALLERGIES: Patient has no known allergies.  Family History  Problem Relation Age of Onset  . Hypertension Father   . Cancer Maternal Grandmother        stomach    Social History   Social History  . Marital status: Married    Spouse name: N/A  . Number of children: N/A  . Years of education: N/A   Occupational History  . Not on file.   Social History Main Topics  . Smoking status: Never Smoker  . Smokeless tobacco: Never Used  . Alcohol use 0.6 oz/week    1 Standard drinks or equivalent per week  . Drug use: No  . Sexual activity: Yes    Partners: Male    Birth control/ protection: None   Other Topics Concern  . Not on file   Social History Narrative  . No narrative  on file    ROS:  Pertinent items are noted in HPI.  PHYSICAL EXAMINATION:    BP 110/64 (BP Location: Right Arm, Patient Position: Sitting, Cuff Size: Large)   Pulse 60   Ht 4' 11.75" (1.518 m)   Wt 196 lb (88.9 kg)   LMP 02/08/2017 (Exact Date)   BMI 38.60 kg/m     General appearance: alert, cooperative and appears stated age   Pelvic ultrasound:   Viable IUP, Size equal to dates.  6+1 weeks.  Simple right ovarian cyst - 38 x 29 x 32 mm. Left ovary normal.  Cervix closed.   ASSESSMENT  Early viable IUP.  PLAN  Discussed prenatal care.  Reviewed foods to avoid or limit.  No ETOH.  List of OB providers.  I recommended What to Expect When Expecting. Our best wishes for a healthy and happy pregnancy and delivery!   An After Visit Summary was printed and given to the patient.  __15____ minutes face to face time of which over 50% was spent in counseling.

## 2017-03-22 NOTE — Progress Notes (Signed)
Encounter reviewed by Dr. Brook Amundson C. Silva.  

## 2017-03-22 NOTE — Patient Instructions (Signed)
Best wishes for a healthy and happy pregnancy and delivery!

## 2017-03-26 ENCOUNTER — Telehealth: Payer: Self-pay | Admitting: Obstetrics and Gynecology

## 2017-03-26 NOTE — Telephone Encounter (Signed)
14 weeks is too late to initiate care.  She could see the same doctor group through one of the other locations such as [facility].  They are part of the [facility] system and I would hope would have some of the same resources available in the office setting.

## 2017-03-26 NOTE — Telephone Encounter (Signed)
Spoke with patient, advised as seen below per Dr. Edward JollySilva. Patient states she will call [facility] for scheduling for first available and return call if any additional questions. Advised patient to advise office when scheduling, need for interpreter. Patient verbalizes understanding and is agreeable.  Routing to provider for final review. Patient is agreeable to disposition. Will close encounter.

## 2017-03-26 NOTE — Telephone Encounter (Signed)
Patient has questions for the nurse about setting up an appointment with an OB/GYN. She is scheduled with one in July but wants to be sure that is soon enough.

## 2017-03-26 NOTE — Telephone Encounter (Signed)
Spoke with patient. Patient states she had planned to follow her OB care at [facility], but no appointment availability until July. Patient states 7/5 she will be 14 weeks, will this be too long to wait or does Dr. Edward JollySilva think I should be seen sooner? Patient states she was advised that she would need to return call in June for scheduling , no appointment scheduled at this time. Patient would prefer [facility] for sign language services for spouse. Advised patient would update and review with Dr. Edward JollySilva and return call with recommendations, patient is agreeable.  Dr. Edward JollySilva, please review and advise?

## 2017-04-06 ENCOUNTER — Encounter (HOSPITAL_COMMUNITY): Payer: Self-pay

## 2017-04-06 ENCOUNTER — Inpatient Hospital Stay (HOSPITAL_COMMUNITY)
Admission: AD | Admit: 2017-04-06 | Discharge: 2017-04-06 | Disposition: A | Discharge status: Home / Self Care | Payer: 59 | Source: Ambulatory Visit | Attending: Obstetrics & Gynecology | Admitting: Obstetrics & Gynecology

## 2017-04-06 ENCOUNTER — Ambulatory Visit (HOSPITAL_COMMUNITY)
Admission: EM | Admit: 2017-04-06 | Discharge: 2017-04-06 | Disposition: A | Discharge status: Nursing Home (Includes ICF) | Payer: 59

## 2017-04-06 DIAGNOSIS — O209 Hemorrhage in early pregnancy, unspecified: Secondary | ICD-10-CM | POA: Insufficient documentation

## 2017-04-06 DIAGNOSIS — N939 Abnormal uterine and vaginal bleeding, unspecified: Secondary | ICD-10-CM | POA: Diagnosis present

## 2017-04-06 DIAGNOSIS — Z3A08 8 weeks gestation of pregnancy: Secondary | ICD-10-CM | POA: Insufficient documentation

## 2017-04-06 LAB — CBC
HEMATOCRIT: 33.4 % — AB (ref 36.0–46.0)
HEMOGLOBIN: 11.9 g/dL — AB (ref 12.0–15.0)
MCH: 27.7 pg (ref 26.0–34.0)
MCHC: 35.6 g/dL (ref 30.0–36.0)
MCV: 77.9 fL — AB (ref 78.0–100.0)
Platelets: 309 10*3/uL (ref 150–400)
RBC: 4.29 MIL/uL (ref 3.87–5.11)
RDW: 13.8 % (ref 11.5–15.5)
WBC: 9.7 10*3/uL (ref 4.0–10.5)

## 2017-04-06 LAB — URINALYSIS, ROUTINE W REFLEX MICROSCOPIC
Bilirubin Urine: NEGATIVE
GLUCOSE, UA: NEGATIVE mg/dL
Ketones, ur: NEGATIVE mg/dL
Leukocytes, UA: NEGATIVE
Nitrite: NEGATIVE
Protein, ur: NEGATIVE mg/dL
SPECIFIC GRAVITY, URINE: 1.001 — AB (ref 1.005–1.030)
SQUAMOUS EPITHELIAL / LPF: NONE SEEN
pH: 6 (ref 5.0–8.0)

## 2017-04-06 LAB — WET PREP, GENITAL
Clue Cells Wet Prep HPF POC: NONE SEEN
Sperm: NONE SEEN
Trich, Wet Prep: NONE SEEN
YEAST WET PREP: NONE SEEN

## 2017-04-06 LAB — ABO/RH: ABO/RH(D): A POS

## 2017-04-06 NOTE — MAU Provider Note (Signed)
Chief Complaint: Vaginal Bleeding   First Provider Initiated Contact with Patient 04/06/17 1920      SUBJECTIVE HPI: Kristine Rivera is a 26 y.o. G1P0000 at [redacted]w[redacted]d by LMP who presents to maternity admissions reporting pink and brown spotting when wiping today and on a pantyliner. The bleeding has been light and not requiring a pad.  She denies recent intercourse and has no s/sx of vaginal infection. She has not tried any treatments. There are no associated symptoms.  She had an ultrasound at [facility] by Dr Edward Jolly showing an IUP at 6 weeks on 03/22/17 and plans to start prenatal care with [facility] next month. She denies vaginal itching/burning, urinary symptoms, h/a, dizziness, n/v, or fever/chills.     HPI  Past Medical History:  Diagnosis Date  . Adhesions involving left fallopian tube    adhesions suggested by hysterosalpingogram Oct. 2017  . Bell's palsy    3-4 years ago   Past Surgical History:  Procedure Laterality Date  . WISDOM TOOTH EXTRACTION  04/2016  . WISDOM TOOTH EXTRACTION     summer 2017   Social History   Social History  . Marital status: Married    Spouse name: N/A  . Number of children: N/A  . Years of education: N/A   Occupational History  . Not on file.   Social History Main Topics  . Smoking status: Never Smoker  . Smokeless tobacco: Never Used  . Alcohol use 0.6 oz/week    1 Standard drinks or equivalent per week  . Drug use: No  . Sexual activity: Yes    Partners: Male    Birth control/ protection: None   Other Topics Concern  . Not on file   Social History Narrative  . No narrative on file   No current facility-administered medications on file prior to encounter.    Current Outpatient Prescriptions on File Prior to Encounter  Medication Sig Dispense Refill  . Prenatal Vit-Fe Fumarate-FA (PRENATAL VITAMIN PO) Take 1 tablet by mouth daily.     No Known Allergies  ROS:  Review of Systems  Constitutional: Negative for  chills, fatigue and fever.  Respiratory: Negative for shortness of breath.   Cardiovascular: Negative for chest pain.  Gastrointestinal: Negative for diarrhea, nausea and vomiting.  Genitourinary: Positive for vaginal bleeding. Negative for difficulty urinating, dysuria, flank pain, pelvic pain, vaginal discharge and vaginal pain.  Neurological: Negative for dizziness and headaches.  Psychiatric/Behavioral: Negative.      I have reviewed patient's Past Medical Hx, Surgical Hx, Family Hx, Social Hx, medications and allergies.   Physical Exam   Patient Vitals for the past 24 hrs:  BP Temp Temp src Pulse Resp SpO2 Height Weight  04/06/17 2115 118/67 98 F (36.7 C) Oral 82 17 99 % - -  04/06/17 1855 - - - - - 100 % - -  04/06/17 1844 (!) 127/59 98.2 F (36.8 C) Oral 79 16 100 % 4\' 11"  (1.499 m) 192 lb (87.1 kg)  04/06/17 1815 121/69 97.8 F (36.6 C) Oral 86 18 97 % - 192 lb 1.9 oz (87.1 kg)   Constitutional: Well-developed, well-nourished female in no acute distress.  Cardiovascular: normal rate Respiratory: normal effort GI: Abd soft, non-tender. Pos BS x 4 MS: Extremities nontender, no edema, normal ROM Neurologic: Alert and oriented x 4.  GU: Neg CVAT.  PELVIC EXAM: Cervix pink, visually closed, friable to cotton swab, scant white creamy discharge, vaginal walls and external genitalia normal Bimanual exam: Cervix  0/long/high, firm, anterior, neg CMT, uterus nontender, ~ 8 week size, adnexa without tenderness, enlargement, or mass   LAB RESULTS Results for orders placed or performed during the hospital encounter of 04/06/17 (from the past 24 hour(s))  Urinalysis, Routine w reflex microscopic     Status: Abnormal   Collection Time: 04/06/17  6:08 PM  Result Value Ref Range   Color, Urine STRAW (A) YELLOW   APPearance CLEAR CLEAR   Specific Gravity, Urine 1.001 (L) 1.005 - 1.030   pH 6.0 5.0 - 8.0   Glucose, UA NEGATIVE NEGATIVE mg/dL   Hgb urine dipstick LARGE (A) NEGATIVE    Bilirubin Urine NEGATIVE NEGATIVE   Ketones, ur NEGATIVE NEGATIVE mg/dL   Protein, ur NEGATIVE NEGATIVE mg/dL   Nitrite NEGATIVE NEGATIVE   Leukocytes, UA NEGATIVE NEGATIVE   RBC / HPF 0-5 0 - 5 RBC/hpf   WBC, UA 0-5 0 - 5 WBC/hpf   Bacteria, UA RARE (A) NONE SEEN   Squamous Epithelial / LPF NONE SEEN NONE SEEN  CBC     Status: Abnormal   Collection Time: 04/06/17  7:26 PM  Result Value Ref Range   WBC 9.7 4.0 - 10.5 K/uL   RBC 4.29 3.87 - 5.11 MIL/uL   Hemoglobin 11.9 (L) 12.0 - 15.0 g/dL   HCT 16.133.4 (L) 09.636.0 - 04.546.0 %   MCV 77.9 (L) 78.0 - 100.0 fL   MCH 27.7 26.0 - 34.0 pg   MCHC 35.6 30.0 - 36.0 g/dL   RDW 40.913.8 81.111.5 - 91.415.5 %   Platelets 309 150 - 400 K/uL  ABO/Rh     Status: None   Collection Time: 04/06/17  7:26 PM  Result Value Ref Range   ABO/RH(D) A POS   Wet prep, genital     Status: Abnormal   Collection Time: 04/06/17  8:03 PM  Result Value Ref Range   Yeast Wet Prep HPF POC NONE SEEN NONE SEEN   Trich, Wet Prep NONE SEEN NONE SEEN   Clue Cells Wet Prep HPF POC NONE SEEN NONE SEEN   WBC, Wet Prep HPF POC FEW (A) NONE SEEN   Sperm NONE SEEN     --/--/A POS (05/25 1926)  IMAGING [place] Ob Transvaginal  Result Date: 03/22/2017 SEE PROGRESS NOTE  Bedside US today with IUP visualized, FHR 171, Amniotic fluid subjectively normal, no SCH seen  MAU Management/MDM: Ordered labs and reviewed results. With normal FHR on today's US, likely bleeding from friable cervix. Bleeding precautions reviewed, pt to f/u with [facility] as scheduled, return to MAU as needed for emergencies. Pt stable at time of discharge.  ASSESSMENT 1. Vaginal bleeding in pregnancy, first trimester     PLAN Discharge home with bleeding precautions  Allergies as of 04/06/2017   No Known Allergies     Medication List    TAKE these medications   PRENATAL VITAMIN PO Take 1 tablet by mouth daily.      Follow-up Information    [place] Follow up.    Why:  As scheduled, return to MAU as needed for emergencies Contact information: [address] [place] [address] [phone]           Sharen CounterLisa Leftwich-Kirby Certified Nurse-Midwife 04/06/2017  10:03 PM

## 2017-04-06 NOTE — ED Notes (Signed)
[redacted]  Weeks  Pregnant   Some   Spotting  And  l  Lower  abd  Pain  Kristine  [place]   fnp  Notified    Send  To  [facility]om[facility]  Hospital      Pt  Advised   And  Understands  Plan of  Care

## 2017-04-06 NOTE — MAU Note (Signed)
Went to the bathroom at 5 pm this evening and noticed pink vaginal bleeding when she wiped.  Denies pain at this time. Does report an episode of left sided abdominal pain that felt like a cramp.

## 2017-04-10 LAB — GC/CHLAMYDIA PROBE AMP (~~LOC~~) NOT AT ARMC
CHLAMYDIA, DNA PROBE: NEGATIVE
NEISSERIA GONORRHEA: NEGATIVE

## 2017-04-17 ENCOUNTER — Encounter: Payer: 59 | Admitting: Obstetrics and Gynecology

## 2017-04-23 ENCOUNTER — Encounter: Payer: 59 | Admitting: Obstetrics and Gynecology

## 2017-04-24 LAB — OB RESULTS CONSOLE RPR: RPR: NONREACTIVE

## 2017-04-24 LAB — OB RESULTS CONSOLE GC/CHLAMYDIA
Chlamydia: NEGATIVE
Gonorrhea: NEGATIVE

## 2017-04-24 LAB — OB RESULTS CONSOLE HIV ANTIBODY (ROUTINE TESTING): HIV: NONREACTIVE

## 2017-04-24 LAB — OB RESULTS CONSOLE RUBELLA ANTIBODY, IGM: Rubella: IMMUNE

## 2017-04-24 LAB — OB RESULTS CONSOLE ANTIBODY SCREEN: Antibody Screen: NEGATIVE

## 2017-04-24 LAB — OB RESULTS CONSOLE HEPATITIS B SURFACE ANTIGEN: Hepatitis B Surface Ag: NEGATIVE

## 2017-04-24 LAB — OB RESULTS CONSOLE ABO/RH: RH TYPE: POSITIVE

## 2017-05-08 ENCOUNTER — Other Ambulatory Visit (HOSPITAL_COMMUNITY): Payer: Self-pay | Admitting: Obstetrics & Gynecology

## 2017-05-08 ENCOUNTER — Other Ambulatory Visit: Payer: Self-pay | Admitting: Obstetrics & Gynecology

## 2017-05-08 DIAGNOSIS — E010 Iodine-deficiency related diffuse (endemic) goiter: Secondary | ICD-10-CM

## 2017-05-11 ENCOUNTER — Ambulatory Visit (HOSPITAL_COMMUNITY)
Admission: RE | Admit: 2017-05-11 | Discharge: 2017-05-11 | Disposition: A | Discharge status: Home / Self Care | Payer: 59 | Source: Ambulatory Visit | Attending: Obstetrics & Gynecology | Admitting: Obstetrics & Gynecology

## 2017-05-11 DIAGNOSIS — E010 Iodine-deficiency related diffuse (endemic) goiter: Secondary | ICD-10-CM | POA: Insufficient documentation

## 2017-05-11 DIAGNOSIS — O26891 Other specified pregnancy related conditions, first trimester: Secondary | ICD-10-CM | POA: Diagnosis not present

## 2017-06-20 ENCOUNTER — Ambulatory Visit: Payer: 59 | Admitting: Obstetrics and Gynecology

## 2017-08-01 IMAGING — US US THYROID
1 series · 14 of 25 positions shown · non-contrast
Comparison: None.

CLINICAL DATA: Thyromegaly on physical exam

EXAM:
THYROID ULTRASOUND
TECHNIQUE: Ultrasound examination of the thyroid gland and adjacent soft
tissues was performed.

[Series 1: us thyroid · 0.05mm/px · 14 of 45 slices shown]
[im 1/45]
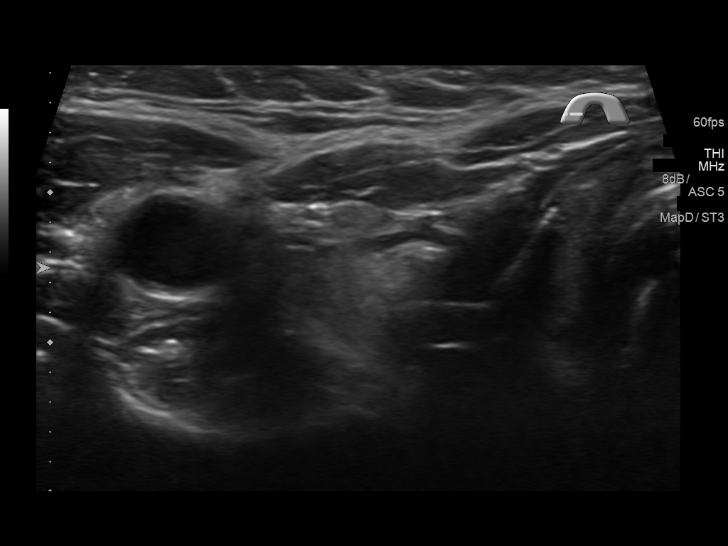
[im 4/45]
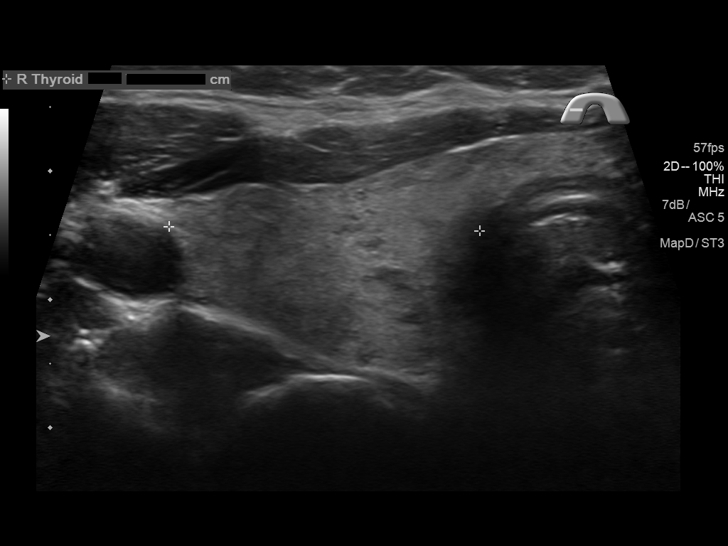
[im 8/45]
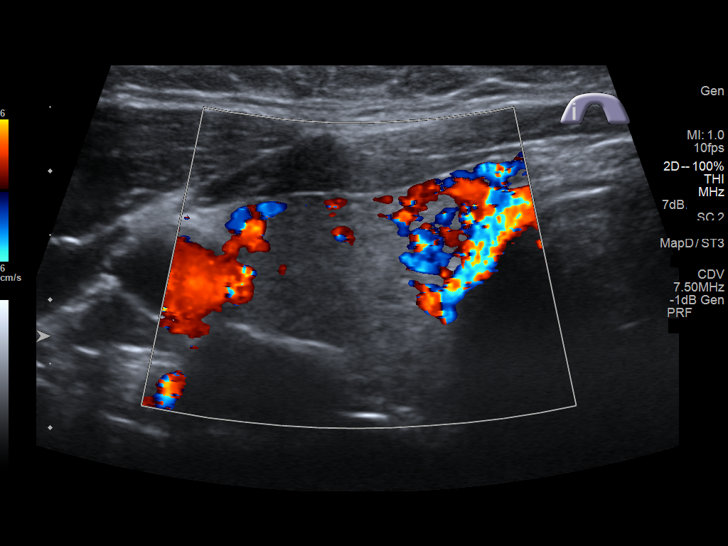
[im 12/45]
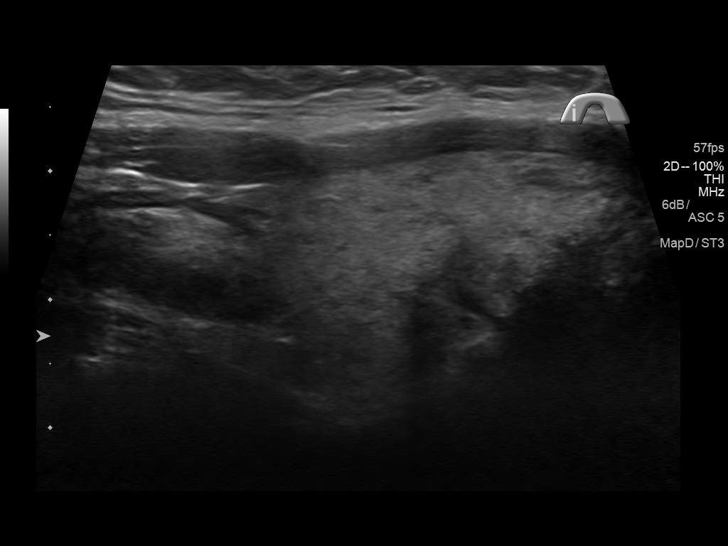
[im 15/45]
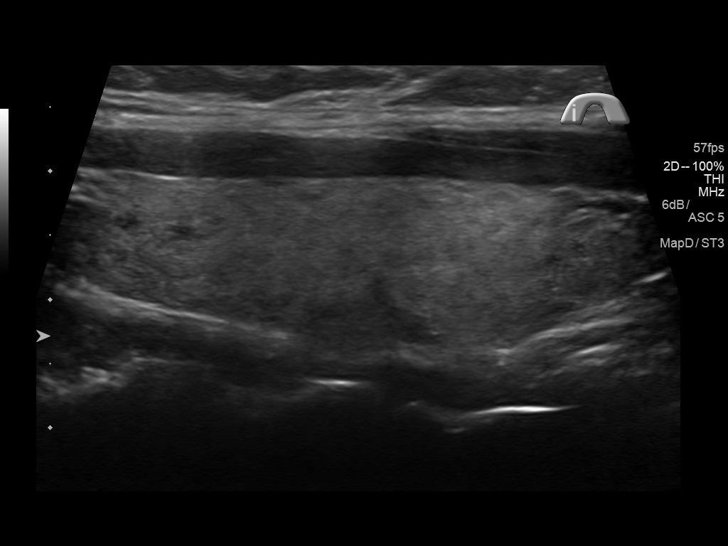
[im 17/45]
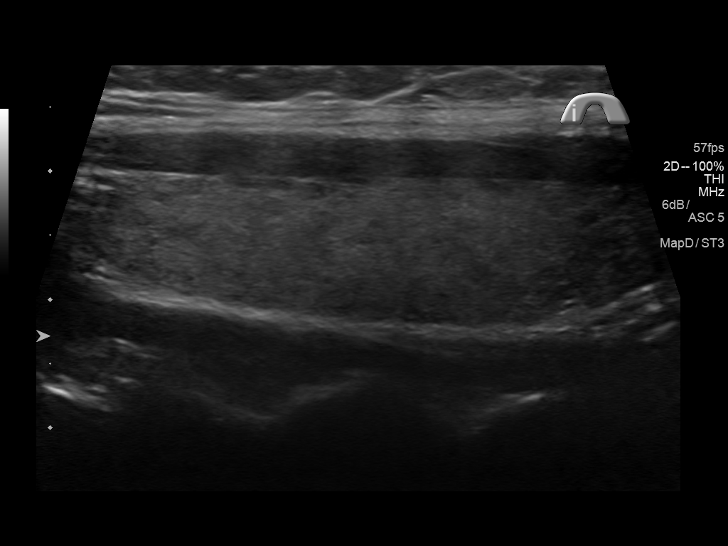
[im 21/45]
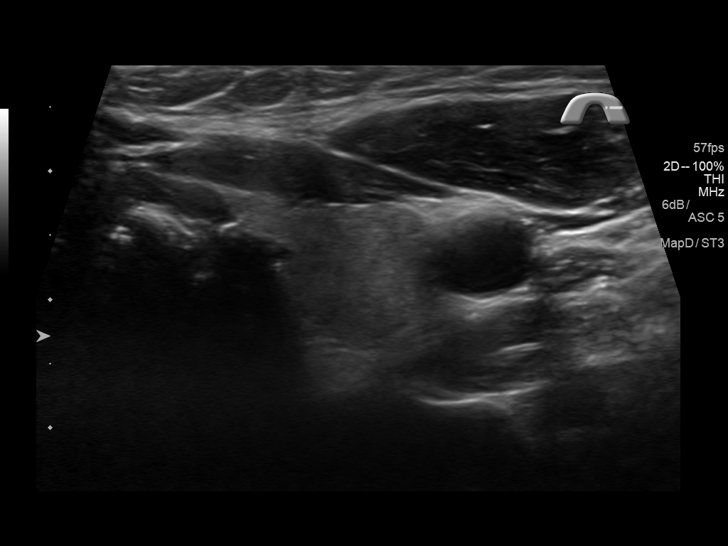
[im 24/45]
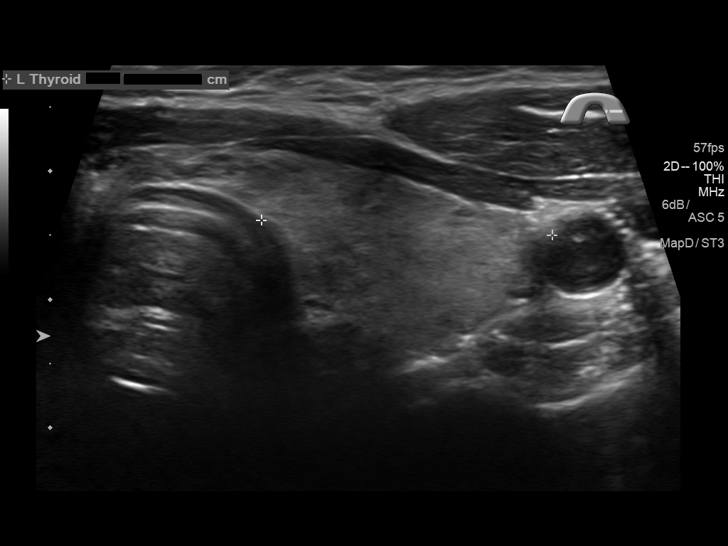
[im 28/45]
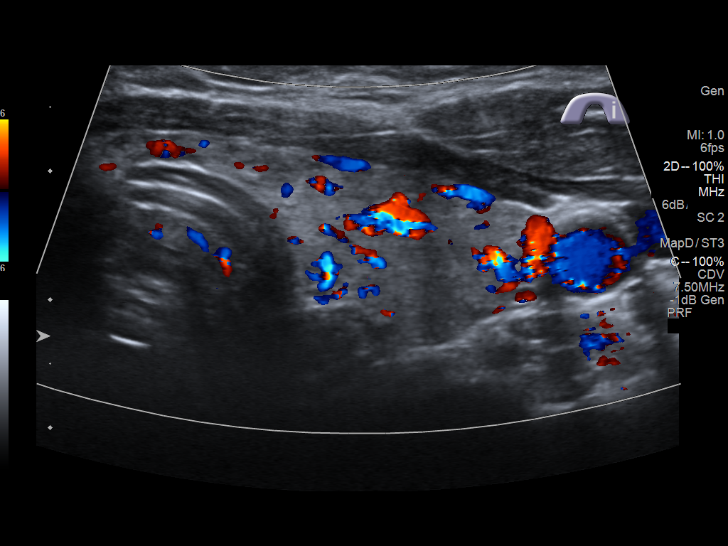
[im 30/45]
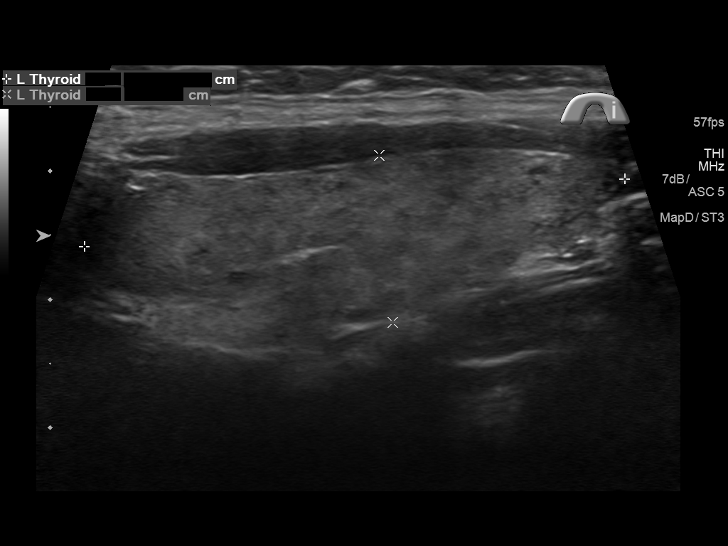
[im 34/45]
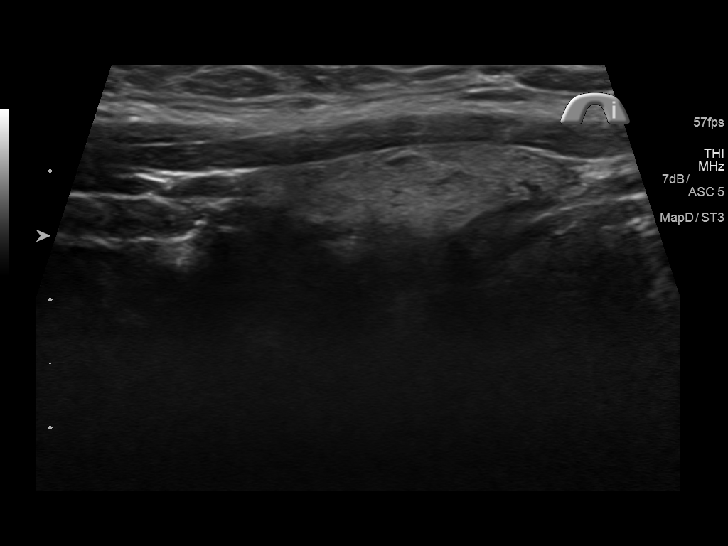
[im 37/45]
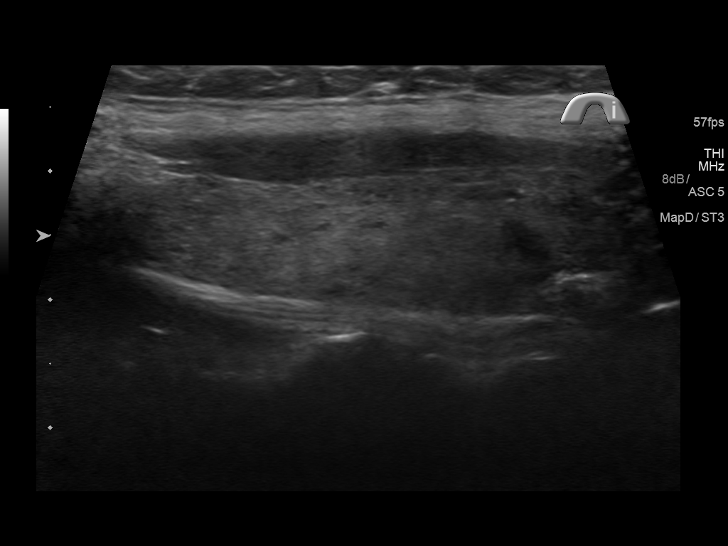
[im 41/45]
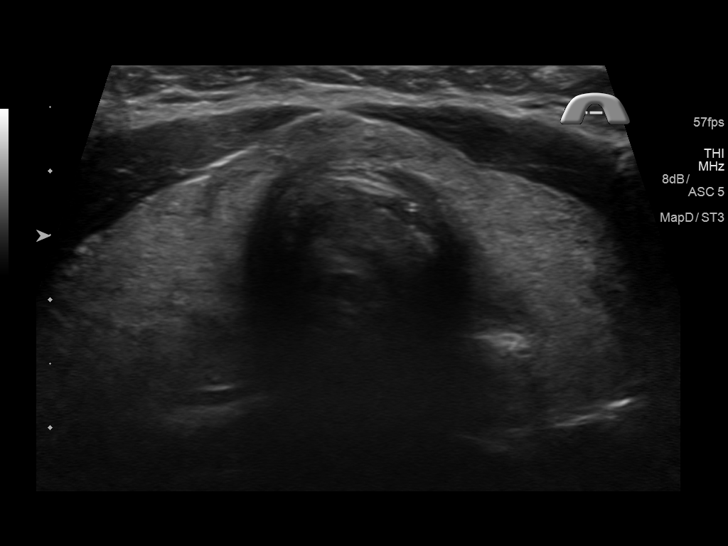
[im 45/45]
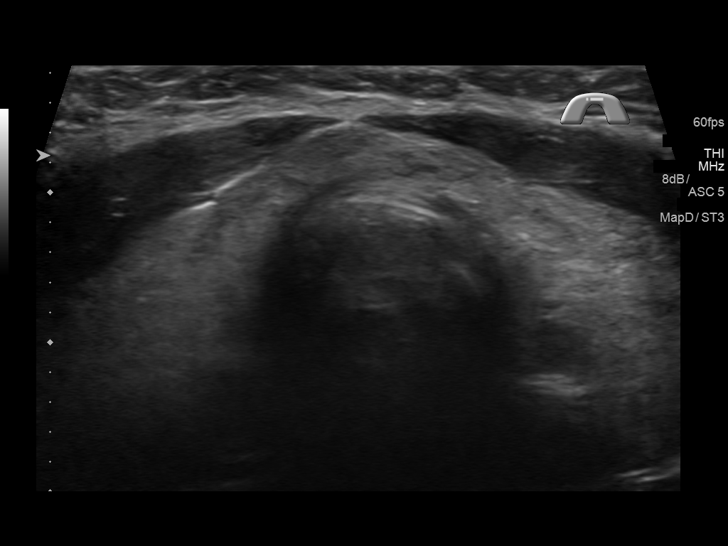

[14 of 25 positions shown; findings below may reference images not displayed]

FINDINGS: Parenchymal Echotexture: Mildly heterogenous, hyperemic

Isthmus: 0.4 cm thickness

Right lobe: 4.5 x 1.7 x 2.4 cm

Left lobe: 4.2 x 1.3 x 2.3 cm

_________________________________________________________

Estimated total number of nodules >/= 1 cm: 0

Number of spongiform nodules >/=  2 cm not described below (TR1): 0

Number of mixed cystic and solid nodules >/= 1.5 cm not described
below (TR2): 0

_________________________________________________________

No discrete nodules are seen within the thyroid gland.
IMPRESSION: Thyroid size upper limits normal with mildly hyperemic and
heterogeneous parenchyma, no nodule or focal lesion

The above is in keeping with the ACR TI-RADS recommendations - [HOSPITAL] 5855;[DATE].

## 2017-10-17 LAB — OB RESULTS CONSOLE GBS: STREP GROUP B AG: NEGATIVE

## 2017-11-15 ENCOUNTER — Inpatient Hospital Stay (HOSPITAL_COMMUNITY)
Admission: AD | Admit: 2017-11-15 | Discharge: 2017-11-19 | DRG: 788 | Disposition: A | Discharge status: Home / Self Care | Payer: 59 | Source: Ambulatory Visit | Attending: Obstetrics and Gynecology | Admitting: Obstetrics and Gynecology

## 2017-11-15 ENCOUNTER — Encounter (HOSPITAL_COMMUNITY): Payer: Self-pay

## 2017-11-15 ENCOUNTER — Other Ambulatory Visit: Payer: Self-pay

## 2017-11-15 DIAGNOSIS — Z3A40 40 weeks gestation of pregnancy: Secondary | ICD-10-CM

## 2017-11-15 DIAGNOSIS — Z98891 History of uterine scar from previous surgery: Secondary | ICD-10-CM

## 2017-11-15 DIAGNOSIS — O4292 Full-term premature rupture of membranes, unspecified as to length of time between rupture and onset of labor: Secondary | ICD-10-CM | POA: Diagnosis present

## 2017-11-15 DIAGNOSIS — O4290 Premature rupture of membranes, unspecified as to length of time between rupture and onset of labor, unspecified weeks of gestation: Secondary | ICD-10-CM | POA: Diagnosis present

## 2017-11-15 LAB — CBC
HCT: 36 % (ref 36.0–46.0)
HEMOGLOBIN: 12.5 g/dL (ref 12.0–15.0)
MCH: 27 pg (ref 26.0–34.0)
MCHC: 34.7 g/dL (ref 30.0–36.0)
MCV: 77.8 fL — ABNORMAL LOW (ref 78.0–100.0)
Platelets: 312 10*3/uL (ref 150–400)
RBC: 4.63 MIL/uL (ref 3.87–5.11)
RDW: 15.4 % (ref 11.5–15.5)
WBC: 8.7 10*3/uL (ref 4.0–10.5)

## 2017-11-15 LAB — TYPE AND SCREEN
ABO/RH(D): A POS
Antibody Screen: NEGATIVE

## 2017-11-15 LAB — POCT FERN TEST: POCT FERN TEST: POSITIVE

## 2017-11-15 MED ORDER — OXYCODONE-ACETAMINOPHEN 5-325 MG PO TABS: 1.0000 | ORAL_TABLET | ORAL | Status: DC | PRN

## 2017-11-15 MED ORDER — LACTATED RINGERS IV SOLN: 500.0000 mL | INTRAVENOUS | Status: DC | PRN

## 2017-11-15 MED ORDER — ACETAMINOPHEN 325 MG PO TABS: 650.0000 mg | ORAL_TABLET | ORAL | Status: DC | PRN

## 2017-11-15 MED ORDER — LIDOCAINE HCL (PF) 1 % IJ SOLN: 30.0000 mL | INTRAMUSCULAR | Status: DC | PRN

## 2017-11-15 MED ORDER — FLEET ENEMA 7-19 GM/118ML RE ENEM: 1.0000 | ENEMA | RECTAL | Status: DC | PRN

## 2017-11-15 MED ORDER — BUTORPHANOL TARTRATE 1 MG/ML IJ SOLN: 1.0000 mg | INTRAMUSCULAR | Status: DC | PRN

## 2017-11-15 MED ORDER — OXYCODONE-ACETAMINOPHEN 5-325 MG PO TABS: 2.0000 | ORAL_TABLET | ORAL | Status: DC | PRN

## 2017-11-15 MED ORDER — ONDANSETRON HCL 4 MG/2ML IJ SOLN
4.0000 mg | Freq: Four times a day (QID) | INTRAMUSCULAR | Status: DC | PRN
  Administered 2017-11-16: 4 mg via INTRAVENOUS
  Filled 2017-11-15: qty 2

## 2017-11-15 MED ORDER — SOD CITRATE-CITRIC ACID 500-334 MG/5ML PO SOLN
30.0000 mL | ORAL | Status: DC | PRN
  Administered 2017-11-16: 30 mL via ORAL
  Filled 2017-11-15: qty 15

## 2017-11-15 MED ORDER — LACTATED RINGERS IV SOLN
INTRAVENOUS | Status: DC
  Administered 2017-11-15 – 2017-11-16 (×4): via INTRAVENOUS

## 2017-11-15 MED ORDER — OXYTOCIN BOLUS FROM INFUSION: 500.0000 mL | Freq: Once | INTRAVENOUS | Status: DC

## 2017-11-15 MED ORDER — OXYTOCIN 40 UNITS IN LACTATED RINGERS INFUSION - SIMPLE MED
2.5000 [IU]/h | INTRAVENOUS | Status: DC
  Filled 2017-11-15: qty 1000

## 2017-11-15 NOTE — Anesthesia Pain Management Evaluation Note (Signed)
  [facility] Pain Management Visit Note  Patient: Kristine Rivera, 27 y.o., female  "Hello I am a member of the anesthesia team at [facility]. We have an anesthesia team available at all times to provide care throughout the hospital, including epidural management and anesthesia for C-section. I don't know your plan for the delivery whether it a natural birth, water birth, IV sedation, nitrous supplementation, doula or epidural, but we want to meet your pain goals."   1.Was your pain managed to your expectations on prior hospitalizations?   Yes   2.What is your expectation for pain management during this hospitalization?     Labor support without medications  3.How can we help you reach that goal? Nursing support  Record the patient's initial score and the patient's pain goal.   Pain: 0/10  Pain Goal: 10/10  [facility] wants you to be able to say your pain was always managed very well.  Salome ArntSterling, Ihor Meinzer Marie 11/15/2017

## 2017-11-15 NOTE — H&P (Signed)
Kristine Rivera is a 27 y.o. female presenting for SROM at office. 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 FOB deaf-non genetic.  OB History    Gravida Para Term Preterm AB Living   1 0 0 0 0 0   SAB TAB Ectopic Multiple Live Births   0 0 0 0 0     Past Medical History:  Diagnosis Date  . Adhesions involving left fallopian tube    adhesions suggested by hysterosalpingogram Oct. 2017  . Bell's palsy    3-4 years ago   Past Surgical History:  Procedure Laterality Date  . WISDOM TOOTH EXTRACTION  04/2016  . WISDOM TOOTH EXTRACTION     summer 2017   Family History: family history includes Cancer in her maternal grandmother; Hypertension in her father. Social History:  reports that  has never smoked. she has never used smokeless tobacco. She reports that she drinks about 0.6 oz of alcohol per week. She reports that she does not use drugs.     Maternal Diabetes: No Genetic Screening: Normal Maternal Ultrasounds/Referrals: Normal Fetal Ultrasounds or other Referrals:  None Maternal Substance Abuse:  No Significant Maternal Medications:  None Significant Maternal Lab Results:  None Other Comments:  None  Review of Systems  Eyes: Negative for blurred vision.  Gastrointestinal: Negative for abdominal pain.  Neurological: Negative for headaches.   Maternal Medical History:  Fetal activity: Perceived fetal activity is normal.        Blood pressure 139/85, pulse 96, temperature 98.4 F (36.9 C), temperature source Oral, resp. rate 18, last menstrual period 02/08/2017. Exam Physical Exam  Cardiovascular: Normal rate.  Respiratory: Effort normal.  GI: Soft.    Prenatal labs: ABO, Rh: A/Positive/-- (06/12 0000) Antibody: Negative (06/12 0000) Rubella: Immune (06/12 0000) RPR: Nonreactive (06/12 0000)  HBsAg: Negative (06/12 0000)  HIV: Non-reactive (06/12 0000)  GBS: Negative (12/05 0000)   Assessment/Plan: 27 yo G1P0 @ 40 0/7 weeks with PROM GBBS  negative   Roselle LocusJames E Juvenal Umar II 11/15/2017, 5:35 PM

## 2017-11-15 NOTE — Progress Notes (Signed)
No changes to H&P  Lungs CTA Cor RRR Cx 3/C/-2 FHT cat one UCs irreg, mild  D/W patient and husband (sign interpreter present) above D/W possible pitocin augmentation in 1 hour if not in good labor pattern

## 2017-11-15 NOTE — MAU Note (Signed)
Pt presents to MAU from OB office with PROM at 4 pm today. Pt denies bleeding and has not had any contractions. +FM

## 2017-11-15 NOTE — Progress Notes (Signed)
Smiling, comfortable  Vitals:   11/15/17 2034 11/15/17 2107  BP: 123/66 96/61  Pulse: (!) 101 95  Resp: 17 18  Temp: 97.9 F (36.6 C)    FHT cat one UCs irreg Cx no change per nurse check  Patient has been walking   A/P: D/W patient, husband, her mother and 2 other individuals in room anticipated length of labor with first baby as well as increasing risk of infection after 24 hours of rupture.  She states she clearly understands and all questions answered. She states she wants to walk and proceed expectantly. She adamantly declines pitocin. She wants to do this "naturally". Emphasized need to assure FWB on monitor periodically and monitor her temperature.

## 2017-11-16 ENCOUNTER — Other Ambulatory Visit: Payer: Self-pay

## 2017-11-16 ENCOUNTER — Encounter (HOSPITAL_COMMUNITY)
Admission: AD | Disposition: A | Discharge status: Home / Self Care | Payer: Self-pay | Source: Ambulatory Visit | Attending: Obstetrics and Gynecology

## 2017-11-16 ENCOUNTER — Inpatient Hospital Stay (HOSPITAL_COMMUNITY): Payer: 59 | Admitting: Anesthesiology

## 2017-11-16 LAB — SYPHILIS: RPR W/REFLEX TO RPR TITER AND TREPONEMAL ANTIBODIES, TRADITIONAL SCREENING AND DIAGNOSIS ALGORITHM: RPR Ser Ql: NONREACTIVE

## 2017-11-16 SURGERY — Surgical Case
Anesthesia: Epidural | Wound class: Clean Contaminated

## 2017-11-16 MED ORDER — EPHEDRINE 5 MG/ML INJ: 10.0000 mg | INTRAVENOUS | Status: DC | PRN

## 2017-11-16 MED ORDER — ONDANSETRON HCL 4 MG/2ML IJ SOLN
INTRAMUSCULAR | Status: AC
  Filled 2017-11-16: qty 2

## 2017-11-16 MED ORDER — LACTATED RINGERS IV SOLN: INTRAVENOUS | Status: DC

## 2017-11-16 MED ORDER — FENTANYL 2.5 MCG/ML BUPIVACAINE 1/10 % EPIDURAL INFUSION (WH - ANES)
14.0000 mL/h | INTRAMUSCULAR | Status: DC | PRN
  Administered 2017-11-16: 14 mL/h via EPIDURAL
  Filled 2017-11-16: qty 100

## 2017-11-16 MED ORDER — OXYTOCIN 10 UNIT/ML IJ SOLN
INTRAMUSCULAR | Status: AC
  Filled 2017-11-16: qty 4

## 2017-11-16 MED ORDER — LACTATED RINGERS IV SOLN: 500.0000 mL | Freq: Once | INTRAVENOUS | Status: DC

## 2017-11-16 MED ORDER — FENTANYL CITRATE (PF) 100 MCG/2ML IJ SOLN: 25.0000 ug | INTRAMUSCULAR | Status: DC | PRN

## 2017-11-16 MED ORDER — MORPHINE SULFATE (PF) 0.5 MG/ML IJ SOLN
INTRAMUSCULAR | Status: AC
  Filled 2017-11-16: qty 10

## 2017-11-16 MED ORDER — PHENYLEPHRINE 40 MCG/ML (10ML) SYRINGE FOR IV PUSH (FOR BLOOD PRESSURE SUPPORT): 80.0000 ug | PREFILLED_SYRINGE | INTRAVENOUS | Status: DC | PRN

## 2017-11-16 MED ORDER — LIDOCAINE HCL (PF) 1 % IJ SOLN
INTRAMUSCULAR | Status: DC | PRN
  Administered 2017-11-16 (×2): 5 mL via EPIDURAL

## 2017-11-16 MED ORDER — MORPHINE SULFATE (PF) 0.5 MG/ML IJ SOLN
INTRAMUSCULAR | Status: DC | PRN
  Administered 2017-11-16: 1 mg via INTRAVENOUS
  Administered 2017-11-16: 4 mg via EPIDURAL

## 2017-11-16 MED ORDER — LACTATED RINGERS IV SOLN
INTRAVENOUS | Status: DC | PRN
  Administered 2017-11-16: 21:00:00 via INTRAVENOUS
  Administered 2017-11-17: 10:00:00

## 2017-11-16 MED ORDER — MEPERIDINE HCL 25 MG/ML IJ SOLN
INTRAMUSCULAR | Status: DC | PRN
  Administered 2017-11-16 (×2): 12.5 mg via INTRAVENOUS

## 2017-11-16 MED ORDER — CEFAZOLIN SODIUM-DEXTROSE 2-4 GM/100ML-% IV SOLN
2.0000 g | Freq: Once | INTRAVENOUS | Status: AC
  Administered 2017-11-16: 2 g via INTRAVENOUS
  Filled 2017-11-16: qty 100

## 2017-11-16 MED ORDER — ONDANSETRON HCL 4 MG/2ML IJ SOLN
INTRAMUSCULAR | Status: DC | PRN
  Administered 2017-11-16: 4 mg via INTRAVENOUS

## 2017-11-16 MED ORDER — DEXAMETHASONE SODIUM PHOSPHATE 10 MG/ML IJ SOLN
INTRAMUSCULAR | Status: DC | PRN
  Administered 2017-11-16: 10 mg via INTRAVENOUS

## 2017-11-16 MED ORDER — FENTANYL CITRATE (PF) 100 MCG/2ML IJ SOLN
INTRAMUSCULAR | Status: AC
  Filled 2017-11-16: qty 2

## 2017-11-16 MED ORDER — TERBUTALINE SULFATE 1 MG/ML IJ SOLN: 0.2500 mg | Freq: Once | INTRAMUSCULAR | Status: DC | PRN

## 2017-11-16 MED ORDER — DEXAMETHASONE SODIUM PHOSPHATE 10 MG/ML IJ SOLN
INTRAMUSCULAR | Status: AC
  Filled 2017-11-16: qty 1

## 2017-11-16 MED ORDER — OXYTOCIN 40 UNITS IN LACTATED RINGERS INFUSION - SIMPLE MED
1.0000 m[IU]/min | INTRAVENOUS | Status: DC
  Administered 2017-11-16: 16 m[IU]/min via INTRAVENOUS
  Administered 2017-11-16: 18 m[IU]/min via INTRAVENOUS
  Administered 2017-11-16: 12 m[IU]/min via INTRAVENOUS
  Administered 2017-11-16: 8 m[IU]/min via INTRAVENOUS
  Administered 2017-11-16: 20 m[IU]/min via INTRAVENOUS
  Administered 2017-11-16: 2 m[IU]/min via INTRAVENOUS
  Administered 2017-11-16: 10 m[IU]/min via INTRAVENOUS
  Administered 2017-11-16: 4 m[IU]/min via INTRAVENOUS
  Administered 2017-11-16: 6 m[IU]/min via INTRAVENOUS
  Administered 2017-11-16: 14 m[IU]/min via INTRAVENOUS

## 2017-11-16 MED ORDER — MEPERIDINE HCL 25 MG/ML IJ SOLN
6.2500 mg | INTRAMUSCULAR | Status: DC | PRN
Start: 2017-11-16 — End: 2017-11-16

## 2017-11-16 MED ORDER — OXYTOCIN 10 UNIT/ML IJ SOLN
INTRAVENOUS | Status: DC | PRN
  Administered 2017-11-16: 40 [IU] via INTRAVENOUS

## 2017-11-16 MED ORDER — CEFAZOLIN SODIUM-DEXTROSE 2-4 GM/100ML-% IV SOLN
INTRAVENOUS | Status: AC
  Filled 2017-11-16: qty 100

## 2017-11-16 MED ORDER — DIPHENHYDRAMINE HCL 50 MG/ML IJ SOLN: 12.5000 mg | INTRAMUSCULAR | Status: DC | PRN

## 2017-11-16 MED ORDER — FENTANYL CITRATE (PF) 100 MCG/2ML IJ SOLN
INTRAMUSCULAR | Status: DC | PRN
  Administered 2017-11-16: 100 ug via INTRAVENOUS

## 2017-11-16 MED ORDER — SODIUM CHLORIDE 0.9 % IR SOLN
Status: DC | PRN
  Administered 2017-11-16: 1

## 2017-11-16 MED ORDER — MEPERIDINE HCL 25 MG/ML IJ SOLN
INTRAMUSCULAR | Status: AC
  Filled 2017-11-16: qty 1

## 2017-11-16 MED ORDER — METOCLOPRAMIDE HCL 5 MG/ML IJ SOLN: 10.0000 mg | Freq: Once | INTRAMUSCULAR | Status: DC | PRN

## 2017-11-16 MED ORDER — SCOPOLAMINE 1 MG/3DAYS TD PT72
MEDICATED_PATCH | TRANSDERMAL | Status: DC | PRN
  Administered 2017-11-16: 1 via TRANSDERMAL

## 2017-11-16 MED ORDER — SCOPOLAMINE 1 MG/3DAYS TD PT72
MEDICATED_PATCH | TRANSDERMAL | Status: AC
  Filled 2017-11-16: qty 1

## 2017-11-16 MED ORDER — SODIUM BICARBONATE 8.4 % IV SOLN
INTRAVENOUS | Status: DC | PRN
  Administered 2017-11-16: 10 mL via EPIDURAL

## 2017-11-16 MED ORDER — PHENYLEPHRINE 40 MCG/ML (10ML) SYRINGE FOR IV PUSH (FOR BLOOD PRESSURE SUPPORT)
80.0000 ug | PREFILLED_SYRINGE | INTRAVENOUS | Status: DC | PRN
  Filled 2017-11-16: qty 10

## 2017-11-16 SURGICAL SUPPLY — 31 items
CHLORAPREP W/TINT 26ML (MISCELLANEOUS) ×3 IMPLANT
CLAMP CORD UMBIL (MISCELLANEOUS) IMPLANT
CLOTH BEACON ORANGE TIMEOUT ST (SAFETY) ×3 IMPLANT
DERMABOND ADVANCED (GAUZE/BANDAGES/DRESSINGS) ×2
DERMABOND ADVANCED .7 DNX12 (GAUZE/BANDAGES/DRESSINGS) ×1 IMPLANT
DRSG OPSITE POSTOP 4X10 (GAUZE/BANDAGES/DRESSINGS) ×3 IMPLANT
ELECT REM PT RETURN 9FT ADLT (ELECTROSURGICAL) ×3
ELECTRODE REM PT RTRN 9FT ADLT (ELECTROSURGICAL) ×1 IMPLANT
EXTRACTOR VACUUM M CUP 4 TUBE (SUCTIONS) IMPLANT
EXTRACTOR VACUUM M CUP 4' TUBE (SUCTIONS)
GLOVE BIO SURGEON STRL SZ 6.5 (GLOVE) ×2 IMPLANT
GLOVE BIO SURGEONS STRL SZ 6.5 (GLOVE) ×1
GLOVE BIOGEL PI IND STRL 7.0 (GLOVE) ×2 IMPLANT
GLOVE BIOGEL PI INDICATOR 7.0 (GLOVE) ×4
GOWN STRL REUS W/TWL LRG LVL3 (GOWN DISPOSABLE) ×6 IMPLANT
HOVERMATT SINGLE USE (MISCELLANEOUS) ×3 IMPLANT
KIT ABG SYR 3ML LUER SLIP (SYRINGE) IMPLANT
NEEDLE HYPO 25X5/8 SAFETYGLIDE (NEEDLE) IMPLANT
NS IRRIG 1000ML POUR BTL (IV SOLUTION) ×3 IMPLANT
PACK C SECTION WH (CUSTOM PROCEDURE TRAY) ×3 IMPLANT
PAD OB MATERNITY 4.3X12.25 (PERSONAL CARE ITEMS) ×3 IMPLANT
PENCIL SMOKE EVAC W/HOLSTER (ELECTROSURGICAL) ×3 IMPLANT
SUT CHROMIC 0 CT 802H (SUTURE) IMPLANT
SUT CHROMIC 0 CTX 36 (SUTURE) ×9 IMPLANT
SUT MON AB-0 CT1 36 (SUTURE) ×3 IMPLANT
SUT PDS AB 0 CTX 60 (SUTURE) ×3 IMPLANT
SUT PLAIN 0 NONE (SUTURE) IMPLANT
SUT VIC AB 4-0 KS 27 (SUTURE) IMPLANT
SYR BULB 3OZ (MISCELLANEOUS) ×3 IMPLANT
TOWEL OR 17X24 6PK STRL BLUE (TOWEL DISPOSABLE) ×3 IMPLANT
TRAY FOLEY BAG SILVER LF 14FR (SET/KITS/TRAYS/PACK) IMPLANT

## 2017-11-16 NOTE — Progress Notes (Signed)
Cesarean Section Procedure Note   Kristine Rivera  11/15/2017 - 11/16/2017  Indications: arrest of dilation   Pre-operative Diagnosis: Arrest of Dilation.   Post-operative Diagnosis: Same   Surgeon: Moishe SpiceSurgeon(s) and Role:    Zelphia Cairo* Broghan Pannone, MD - Primary   Assistants: none  Anesthesia: spinal   Procedure Details:  The patient was seen in the Holding Room. The risks, benefits, complications, treatment options, and expected outcomes were discussed with the patient. The patient concurred with the proposed plan, giving informed consent. identified as Kristine LangeVanessa Laver and the procedure verified as C-Section Delivery. A Time Out was held and the above information confirmed.  After induction of anesthesia, the patient was draped and prepped in the usual sterile manner. A transverse was made and carried down through the subcutaneous tissue to the fascia. Fascial incision was made and extended transversely. The fascia was separated from the underlying rectus tissue superiorly and inferiorly. The peritoneum was identified and entered. Peritoneal incision was extended longitudinally. The utero-vesical peritoneal reflection was incised transversely and the bladder flap was bluntly freed from the lower uterine segment. A low transverse uterine incision was made. Delivered from cephalic presentation was a viable female infant. Cord ph was not sent the umbilical cord was clamped and cut cord blood was obtained for evaluation. The placenta was removed Intact and appeared normal. The uterine outline, tubes and ovaries appeared normal}. The uterine incision was closed with running locked sutures of 0chromic gut.  Extension of uterine incision down left repaired with 0 chromic and hemostatic. Hemostasis was observed. Lavage was carried out until clear. Peritoneum closed with monocryl. The fascia was then reapproximated with running sutures of 0PDS.  The skin was closed with 4-0Vicryl.   Instrument, sponge, and  needle counts were correct prior the abdominal closure and were correct at the conclusion of the case.    Findings:   Estimated Blood Loss: 900 mL   Urine Output: clear  Complications: no complications  Disposition: PACU - hemodynamically stable.   Maternal Condition: stable   Baby condition / location:  In OR warmer with father & interpreter by side  Attending Attestation: I was present and scrubbed for the entire procedure.   Signed: Surgeon(s): Zelphia CairoAdkins, Kamaiyah Uselton, MD

## 2017-11-16 NOTE — Progress Notes (Signed)
Pt feeling occasional ctx  FHT cat 1 Toco Q5 Cvx 4/80/-2  A/P:  Continue pitocin augmentation

## 2017-11-16 NOTE — Anesthesia Procedure Notes (Signed)
Epidural Patient location during procedure: OB Start time: 11/16/2017 4:40 PM End time: 11/16/2017 5:55 PM  Staffing Anesthesiologist: Bethena Midgetddono, Stefany Starace, MD  Preanesthetic Checklist Completed: patient identified, site marked, surgical consent, pre-op evaluation, timeout performed, IV checked, risks and benefits discussed and monitors and equipment checked  Epidural Patient position: sitting Prep: site prepped and draped and DuraPrep Patient monitoring: continuous pulse ox and blood pressure Approach: midline Injection technique: LOR air  Needle:  Needle type: Tuohy  Needle gauge: 17 G Needle length: 9 cm and 9 Needle insertion depth: 7 cm Catheter type: closed end flexible Catheter size: 19 Gauge Catheter at skin depth: 10 cm Test dose: negative  Assessment Events: blood not aspirated, injection not painful, no injection resistance, negative IV test and no paresthesia

## 2017-11-16 NOTE — Consult Note (Signed)
Neonatology Note:   Attendance at C-section:    I was asked by Dr. Renaldo FiddlerAdkins to attend this primary [facility] The mother is a G1P0 A pos, GBS neg with HbC trait (father negative). ROM 29 hours prior to delivery, fluid clear. Infant vigorous with good spontaneous cry and tone. Delayed cord clamping was done. Needed only minimal bulb suctioning. Ap 9/10. Lungs clear to ausc in DR. PE remarkable for head molding and for labial enlargement; the right labia has a firm feel to it, while the left side is smaller and does not feel firm. There is no history of infant having been in breech position, so the reason for edema is unclear. Cannot rule out a mass. The vaginal opening appears normal and there is no clitoral enlargement, so doubt indeterminate sex/adrenal axis issues. Anus is patent and she passed a small amount of meconium just after delivery. I spoke with the parents about this finding and told them we would assess again in the morning. If the edema is not subsiding and the right side still feels firm, I would recommend getting an ultrasound to ascertain if there is a solid mass present. To CN to care of Pediatrician.   Doretha Souhristie C. Romain Erion, MD

## 2017-11-16 NOTE — Anesthesia Preprocedure Evaluation (Signed)

## 2017-11-16 NOTE — Progress Notes (Signed)
Pt comfortable, feeling occasional contractions  FHT cat 1 Toco Q10-12 Cvx 3cm  A/P:  PROM - recommend pitocin augmentation Pt agrees to proceed.

## 2017-11-16 NOTE — Progress Notes (Signed)
Pt comfortable  FHT decrease variability in 140s but good scalp stim to 150s Toco q1-3 Cvx unchanged  A/P:  Discussed with pt arrest of dilation Will recheck in 1 hr and consider c-section if no cervical change

## 2017-11-16 NOTE — Transfer of Care (Signed)
Immediate Anesthesia Transfer of Care Note  Patient: Kristine Rivera  Procedure(s) Performed: CESAREAN SECTION (N/A )  Patient Location: PACU  Anesthesia Type:Epidural  Level of Consciousness: awake, alert  and oriented  Airway & Oxygen Therapy: Patient Spontanous Breathing  Post-op Assessment: Report given to RN and Post -op Vital signs reviewed and stable  Post vital signs: Reviewed and stable  Last Vitals:  Vitals:   11/16/17 2030 11/16/17 2203  BP: 117/82 (!) 131/51  Pulse: (!) 145   Resp: 16   Temp: 36.8 C     Last Pain:  Vitals:   11/16/17 2030  TempSrc: Oral  PainSc:       Patients Stated Pain Goal: 5 (11/16/17 2000)  Complications: No apparent anesthesia complications

## 2017-11-16 NOTE — Progress Notes (Signed)
Pt not feeling contractions but c/o pain in her neck and shivering.  FHT decrease in variability, + scalp stim Toco Q2-2 Cvx unchanged  A/P:  Given continued decrease in variability despite decrease in pitocin, I recommend primary c-section Risks including but not limited to infection, bleeding, blood transfusion and damage to surrounding organs discussed All of her questions answered, informed consent obtained Interpreter arrived for husband Ancef ordered

## 2017-11-16 NOTE — Progress Notes (Signed)
Pt comfortable w/ epidural Toco Q1-5 Cvx 6-7/C/-2  A/P:  Continue pitocin augmentation

## 2017-11-17 ENCOUNTER — Encounter (HOSPITAL_COMMUNITY): Payer: Self-pay | Admitting: Obstetrics and Gynecology

## 2017-11-17 DIAGNOSIS — Z98891 History of uterine scar from previous surgery: Secondary | ICD-10-CM

## 2017-11-17 LAB — CBC
HCT: 26.5 % — ABNORMAL LOW (ref 36.0–46.0)
Hemoglobin: 9.2 g/dL — ABNORMAL LOW (ref 12.0–15.0)
MCH: 27.4 pg (ref 26.0–34.0)
MCHC: 35.1 g/dL (ref 30.0–36.0)
MCV: 77.9 fL — ABNORMAL LOW (ref 78.0–100.0)
PLATELETS: 283 10*3/uL (ref 150–400)
RBC: 3.4 MIL/uL — ABNORMAL LOW (ref 3.87–5.11)
RDW: 15.6 % — ABNORMAL HIGH (ref 11.5–15.5)
WBC: 19 10*3/uL — ABNORMAL HIGH (ref 4.0–10.5)

## 2017-11-17 MED ORDER — COCONUT OIL OIL: 1.0000 "application " | TOPICAL_OIL | Status: DC | PRN

## 2017-11-17 MED ORDER — MENTHOL 3 MG MT LOZG: 1.0000 | LOZENGE | OROMUCOSAL | Status: DC | PRN

## 2017-11-17 MED ORDER — PRENATAL MULTIVITAMIN CH
1.0000 | ORAL_TABLET | Freq: Every day | ORAL | Status: DC
  Administered 2017-11-17 – 2017-11-19 (×3): 1 via ORAL
  Filled 2017-11-17 (×3): qty 1

## 2017-11-17 MED ORDER — DIPHENHYDRAMINE HCL 25 MG PO CAPS
25.0000 mg | ORAL_CAPSULE | Freq: Four times a day (QID) | ORAL | Status: DC | PRN
  Administered 2017-11-17: 25 mg via ORAL
  Filled 2017-11-17: qty 1

## 2017-11-17 MED ORDER — DEXTROSE IN LACTATED RINGERS 5 % IV SOLN: INTRAVENOUS | Status: DC

## 2017-11-17 MED ORDER — NALOXONE HCL 0.4 MG/ML IJ SOLN: 0.4000 mg | INTRAMUSCULAR | Status: DC | PRN

## 2017-11-17 MED ORDER — OXYCODONE-ACETAMINOPHEN 5-325 MG PO TABS
1.0000 | ORAL_TABLET | ORAL | Status: DC | PRN
  Administered 2017-11-17: 1 via ORAL
  Filled 2017-11-17: qty 1

## 2017-11-17 MED ORDER — IBUPROFEN 600 MG PO TABS
600.0000 mg | ORAL_TABLET | Freq: Four times a day (QID) | ORAL | Status: DC
  Administered 2017-11-17 – 2017-11-19 (×10): 600 mg via ORAL
  Filled 2017-11-17 (×10): qty 1

## 2017-11-17 MED ORDER — SCOPOLAMINE 1 MG/3DAYS TD PT72: 1.0000 | MEDICATED_PATCH | Freq: Once | TRANSDERMAL | Status: DC

## 2017-11-17 MED ORDER — SODIUM CHLORIDE 0.9% FLUSH: 3.0000 mL | INTRAVENOUS | Status: DC | PRN

## 2017-11-17 MED ORDER — KETOROLAC TROMETHAMINE 30 MG/ML IJ SOLN: 30.0000 mg | Freq: Four times a day (QID) | INTRAMUSCULAR | Status: AC | PRN

## 2017-11-17 MED ORDER — DIPHENHYDRAMINE HCL 50 MG/ML IJ SOLN
12.5000 mg | INTRAMUSCULAR | Status: DC | PRN
Start: 2017-11-17 — End: 2017-11-19

## 2017-11-17 MED ORDER — DEXTROSE 5 % IV SOLN: 1.0000 ug/kg/h | INTRAVENOUS | Status: DC | PRN

## 2017-11-17 MED ORDER — NALBUPHINE HCL 10 MG/ML IJ SOLN: 5.0000 mg | Freq: Once | INTRAMUSCULAR | Status: DC | PRN

## 2017-11-17 MED ORDER — ACETAMINOPHEN 500 MG PO TABS
1000.0000 mg | ORAL_TABLET | Freq: Four times a day (QID) | ORAL | Status: AC
  Administered 2017-11-17 – 2017-11-18 (×2): 1000 mg via ORAL
  Filled 2017-11-17 (×2): qty 2

## 2017-11-17 MED ORDER — TETANUS-DIPHTH-ACELL PERTUSSIS 5-2.5-18.5 LF-MCG/0.5 IM SUSP: 0.5000 mL | Freq: Once | INTRAMUSCULAR | Status: DC

## 2017-11-17 MED ORDER — MEDROXYPROGESTERONE ACETATE 150 MG/ML IM SUSP: 150.0000 mg | INTRAMUSCULAR | Status: DC | PRN

## 2017-11-17 MED ORDER — NALBUPHINE HCL 10 MG/ML IJ SOLN
5.0000 mg | Freq: Once | INTRAMUSCULAR | Status: DC | PRN
Start: 2017-11-17 — End: 2017-11-19

## 2017-11-17 MED ORDER — OXYTOCIN 40 UNITS IN LACTATED RINGERS INFUSION - SIMPLE MED: 2.5000 [IU]/h | INTRAVENOUS | Status: AC

## 2017-11-17 MED ORDER — SIMETHICONE 80 MG PO CHEW
80.0000 mg | CHEWABLE_TABLET | Freq: Three times a day (TID) | ORAL | Status: DC
  Administered 2017-11-17 – 2017-11-19 (×8): 80 mg via ORAL
  Filled 2017-11-17 (×8): qty 1

## 2017-11-17 MED ORDER — WITCH HAZEL-GLYCERIN EX PADS: 1.0000 "application " | MEDICATED_PAD | CUTANEOUS | Status: DC | PRN

## 2017-11-17 MED ORDER — DIBUCAINE 1 % RE OINT: 1.0000 "application " | TOPICAL_OINTMENT | RECTAL | Status: DC | PRN

## 2017-11-17 MED ORDER — SIMETHICONE 80 MG PO CHEW: 80.0000 mg | CHEWABLE_TABLET | ORAL | Status: DC | PRN

## 2017-11-17 MED ORDER — OXYCODONE-ACETAMINOPHEN 5-325 MG PO TABS
2.0000 | ORAL_TABLET | ORAL | Status: DC | PRN
  Administered 2017-11-18 – 2017-11-19 (×2): 2 via ORAL
  Filled 2017-11-17 (×2): qty 2

## 2017-11-17 MED ORDER — DIPHENHYDRAMINE HCL 25 MG PO CAPS: 25.0000 mg | ORAL_CAPSULE | ORAL | Status: DC | PRN

## 2017-11-17 MED ORDER — ACETAMINOPHEN 325 MG PO TABS: 650.0000 mg | ORAL_TABLET | ORAL | Status: DC | PRN

## 2017-11-17 MED ORDER — SIMETHICONE 80 MG PO CHEW
80.0000 mg | CHEWABLE_TABLET | ORAL | Status: DC
  Administered 2017-11-18: 80 mg via ORAL
  Filled 2017-11-17: qty 1

## 2017-11-17 MED ORDER — ONDANSETRON HCL 4 MG/2ML IJ SOLN: 4.0000 mg | Freq: Three times a day (TID) | INTRAMUSCULAR | Status: DC | PRN

## 2017-11-17 MED ORDER — NALBUPHINE HCL 10 MG/ML IJ SOLN: 5.0000 mg | INTRAMUSCULAR | Status: DC | PRN

## 2017-11-17 MED ORDER — MEASLES, MUMPS & RUBELLA VAC ~~LOC~~ INJ
0.5000 mL | INJECTION | Freq: Once | SUBCUTANEOUS | Status: DC
  Filled 2017-11-17: qty 0.5

## 2017-11-17 MED ORDER — SENNOSIDES-DOCUSATE SODIUM 8.6-50 MG PO TABS
2.0000 | ORAL_TABLET | ORAL | Status: DC
  Administered 2017-11-18 (×2): 2 via ORAL
  Filled 2017-11-17: qty 2

## 2017-11-17 NOTE — Plan of Care (Signed)
  Role Relationship: Ability to demonstrate positive interaction with newborn will improve 11/17/2017 1144 by Karn Cassissborne, Keaten Mashek H, RN Note Set mother up with double electric breast pump and explained how to use and frequency. Baby not latching/not interested in latching and mother's nipples are flat/short shafted. Mother attempting to hand express; however only getting a couple of drops. Provided shells. Mother states she is about to have visitors so would like to wait to put on shells and pump. Encouraged mother to tend to her and baby's needs instead of visitors and make visitors wait; however, mother seems more interested in visitors coming instead of pumping. Encouraged mother to call for assistance if/when needed.

## 2017-11-17 NOTE — Plan of Care (Signed)
  Activity: Will verbalize the importance of balancing activity with adequate rest periods 11/17/2017 1043 by Karn Cassissborne, Nasia Cannan H, RN Note Discussed with patient the importance of increasing ambulation today to assist with pain control and gas movement. Patient ambulated well with assistance to bathroom. Encouraged patient to sit in chair more today instead of lying in bed.

## 2017-11-17 NOTE — Anesthesia Postprocedure Evaluation (Signed)
Anesthesia Post Note  Patient: Kristine Rivera  Procedure(s) Performed: CESAREAN SECTION (N/A )     Patient location during evaluation: Mother Baby Anesthesia Type: Epidural Level of consciousness: awake and alert Pain management: pain level controlled Vital Signs Assessment: post-procedure vital signs reviewed and stable Respiratory status: spontaneous breathing, nonlabored ventilation and respiratory function stable Cardiovascular status: stable Postop Assessment: no headache, no backache, epidural receding and patient able to bend at knees Anesthetic complications: no    Last Vitals:  Vitals:   11/17/17 0541 11/17/17 0543  BP:    Pulse:    Resp:    Temp:    SpO2: 97% 97%    Last Pain:  Vitals:   11/17/17 0230  TempSrc: Oral  PainSc: 0-No pain   Pain Goal: Patients Stated Pain Goal: 5 (11/16/17 2203)               Rica RecordsICKELTON,[name]

## 2017-11-17 NOTE — Progress Notes (Signed)
Subjective: Postpartum Day 1: Cesarean Delivery Patient reports incisional pain and tolerating PO.    Objective: Vital signs in last 24 hours: Temp:  [97.7 F (36.5 C)-99.6 F (37.6 C)] 97.7 F (36.5 C) (01/05 0230) Pulse Rate:  [89-145] 100 (01/05 0230) Resp:  [16-32] 18 (01/05 0230) BP: (99-131)/(45-87) 112/47 (01/05 0230) SpO2:  [94 %-98 %] 97 % (01/05 0543)  Physical Exam:  General: alert and cooperative Lochia: appropriate Uterine Fundus: firm Incision: healing well, no significant drainage DVT Evaluation: No evidence of DVT seen on physical exam.  Recent Labs    11/15/17 1804 11/17/17 0514  HGB 12.5 9.2*  HCT 36.0 26.5*    Assessment/Plan: Status post Cesarean section. Doing well postoperatively.  Continue current care.  Zelphia CairoGretchen Brianny Soulliere 11/17/2017, 9:41 AM

## 2017-11-17 NOTE — Lactation Note (Addendum)
This note was copied from a baby's chart. Lactation Consultation Note  Patient Name: Kristine Rivera GNFAO'ZToday's Date: 11/17/2017 Reason for consult: Initial assessment;Difficult latch;Primapara;1st time breastfeeding  20 hours old FT female who is being fully BF at this point. Baby is not latching on though, attempted 2 feedings when baby was showing feeding cues but baby won't latch. Mom was already set up with a DEBP and was instructed on how to use it and cleaning. Discussed the possibility of supplementing after 24 hours if baby is unable to latch. Encourage mom to pump at least 8 times/day and also to hand express; keep putting baby skin to skin.  Mom is aware of outpatient services and the breastfeeding support group on Tuesday.  Maternal Data Has patient been taught Hand Expression?: Yes Does the patient have breastfeeding experience prior to this delivery?: No  Feeding Feeding Type: Breast Fed Length of feed: 0 min  LATCH Score Latch: Too sleepy or reluctant, no latch achieved, no sucking elicited.  Audible Swallowing: None  Type of Nipple: Everted at rest and after stimulation  Comfort (Breast/Nipple): Soft / non-tender  Hold (Positioning): Full assist, staff holds infant at breast  LATCH Score: 4  Interventions Interventions: Breast feeding basics reviewed;Assisted with latch;Skin to skin;Breast massage;Adjust position;Support pillows;Position options;Expressed milk;Hand express;DEBP  Lactation Tools Discussed/Used Tools: Shells Shell Type: Sore Pump Review: Setup, frequency, and cleaning;Milk Storage Initiated by:: MPeck Date initiated:: 11/17/17   Consult Status Consult Status: Follow-up Date: 11/18/17 Follow-up type: In-patient    Bedford Winsor Venetia ConstableS Mahika Vanvoorhis 11/17/2017, 6:00 PM

## 2017-11-18 ENCOUNTER — Encounter (HOSPITAL_COMMUNITY): Payer: Self-pay | Admitting: Obstetrics and Gynecology

## 2017-11-18 NOTE — Progress Notes (Signed)
Subjective: Postpartum Day 2: Cesarean Delivery Patient reports incisional pain, tolerating PO, + flatus and no problems voiding.    Objective: Vital signs in last 24 hours: Temp:  [97.7 F (36.5 C)-99.1 F (37.3 C)] 98 F (36.7 C) (01/06 16100633) Pulse Rate:  [82-100] 82 (01/06 0633) Resp:  [16-18] 18 (01/06 [id number]) BP: (92-107)/(49-56) 92/53 (01/06 54090633) SpO2:  [97 %-100 %] 100 % (01/06 [id number])  Physical Exam:  General: alert and cooperative Lochia: appropriate Uterine Fundus: firm Incision: healing well, no significant drainage DVT Evaluation: No evidence of DVT seen on physical exam.  Recent Labs    11/15/17 1804 11/17/17 0514  HGB 12.5 9.2*  HCT 36.0 26.5*    Assessment/Plan: Status post Cesarean section. Doing well postoperatively.  Continue current care.  Kristine Rivera 11/18/2017, 8:41 AM

## 2017-11-18 NOTE — Lactation Note (Addendum)
This note was copied from a baby's chart. Lactation Consultation Note Baby 829 hrs old. Mom stated baby will not stop crying. Baby's cry slightly hoarse sounding and face flushed from crying. Noted tongue to have possible tight frenulum. Has thick labial frenulum needing upper lip flanged. Baby has had 2 voids and 10 stools. Mom has FLAT nipples, not compressible. Baby unable to latch. Mom stated baby will suck on breast but not getting nipple into mouth. Baby appears to be tired after suckling for 10 min. Baby just wants to hold onto breast w/occassional suckle. Discussed newborn feeding habits, behavior, STS, I&O, supply and demand. Mom stated baby BF for 30 min. But unable to get the nipple back into mouth, w/consultbaby would suckle on breast then rest.  Fitted mom w/#16 NS, taught application then assisted latch in football hold. Baby suckling at intervals. Mom able to express colostrum. LC unable to obtain large amount.  Encouraged mom to hold baby and let her suck and rest. Using stimulation to obtain feeding.   Mom has shells for bra, encouraged to wear d/t flat nipples. Reported to RN.  Encouraged mom to post-pump for supplementation and stimulation.  Patient Name: Kristine Rivera JXBJY'NToday's Date: 11/18/2017 Reason for consult: Follow-up assessment;Difficult latch   Maternal Data    Feeding Feeding Type: Breast Fed  LATCH Score Latch: Repeated attempts needed to sustain latch, nipple held in mouth throughout feeding, stimulation needed to elicit sucking reflex.  Audible Swallowing: A few with stimulation  Type of Nipple: Flat  Comfort (Breast/Nipple): Soft / non-tender  Hold (Positioning): Assistance needed to correctly position infant at breast and maintain latch.  LATCH Score: 6  Interventions Interventions: Breast feeding basics reviewed;Breast compression;Assisted with latch;Adjust position;Skin to skin;Support pillows;DEBP;Breast massage;Position options;Hand  express;Expressed milk;Pre-pump if needed;Shells  Lactation Tools Discussed/Used Tools: Shells;Pump;Nipple Shields Nipple shield size: 16 Shell Type: Inverted Breast pump type: Double-Electric Breast Pump   Consult Status Consult Status: Follow-up Date: 11/18/17 Follow-up type: In-patient    Kristine Rivera, Diamond NickelLAURA G 11/18/2017, 3:09 AM

## 2017-11-18 NOTE — Lactation Note (Signed)
This note was copied from a baby's chart. Lactation Consultation Note  Patient Name: Kristine Rivera GNFAO'ZToday's Date: 11/18/2017 Reason for consult: Follow-up assessment;1st time breastfeeding  Follow up visit at 37 hours of age.  Rn requests assist due to mom having difficulty with application of NS and latching with poor positioning.  Mom holding baby at breast with mouth on nipple shield asleep.  Mom reports NS is moving and wont stay on.  LC worked with mom on applying NS with #16 and #20.  Mom can apply #16 on left nipple after stimulation with hand pump.  FOB can apply #20 on right nipple with great fit.   Mom reports recent feeding of 15 minutes on and off sucking.  Baby still acting hungry and sleepy with latching effort.  Baby does not extend tongue well on gloved finger and is tongue thrusting.  LC encouraged suck training.  Mom is using sign language to interpret for FOB who is deaf.  Mom encouraged to clip finger nails to aid in NS application and suck training. LC assisted with latching baby who is opening mouth wide and will suck a few times, but appears to be tongue thrusting and not maintaining feeding.  Baby is fussy when removed from breast. LC noted spot of red blood from right breast possible a montgomery gland or abrasion from hand pumping.  Mom encouraged to use DEBP and reports only holding one at a time. LC encouraged mom to practice with both to get better results.  Mom is having significant difficulty with placing center of nipple in center of flange repeatedly and then moves again after assist. LC is concerned as mom is tired and wears glasses that she may cause nipple damage with pumping.   LC discussed option to feed baby formula due to poor feedings at 37 hours of age and moms difficulty with feeding plan.   Mom agreeable and wants baby fed.  Baby is at 6% weight loss with poor feeding attempts observed.   [facility] Judeth CornfieldStephanie to offer baby gerber formula with  spoon or syringe and teach  parents for feedings as needed.   Mom to finish pumping and eat her breakfast. LC to follow later today as able.     Maternal Data    Feeding Feeding Type: Breast Fed Length of feed: 10 min(per mom until MD came to check baby)  LATCH Score Latch: Repeated attempts needed to sustain latch, nipple held in mouth throughout feeding, stimulation needed to elicit sucking reflex.  Audible Swallowing: None  Type of Nipple: Flat  Comfort (Breast/Nipple): Soft / non-tender  Hold (Positioning): Full assist, staff holds infant at breast  LATCH Score: 4  Interventions Interventions: Breast feeding basics reviewed;Support pillows;Position options;Assisted with latch;Skin to skin;Expressed milk;Breast massage;Hand express;Shells;Pre-pump if needed;Hand pump;Breast compression;Adjust position;DEBP  Lactation Tools Discussed/Used Tools: Shells;Nipple Shields Nipple shield size: 20;16 Shell Type: Inverted Breast pump type: Double-Electric Breast Pump;Manual WIC Program: Yes([facility])   Consult Status Consult Status: Follow-up Date: 11/19/17 Follow-up type: In-patient    Jana Shoptaw 11/18/2017, 10:20 AM

## 2017-11-19 ENCOUNTER — Encounter (HOSPITAL_COMMUNITY): Payer: Self-pay | Admitting: *Deleted

## 2017-11-19 MED ORDER — OXYCODONE-ACETAMINOPHEN 5-325 MG PO TABS: 1.0000 | ORAL_TABLET | Freq: Four times a day (QID) | ORAL | 0 refills | Status: DC | PRN

## 2017-11-19 MED ORDER — IBUPROFEN 600 MG PO TABS: 600.0000 mg | ORAL_TABLET | Freq: Four times a day (QID) | ORAL | 0 refills | Status: AC

## 2017-11-19 NOTE — Lactation Note (Signed)
This note was copied from a baby's chart. Lactation Consultation Note  Patient Name: Girl Sallee LangeVanessa Boyland FAOZH'YToday's Date: 11/19/2017 Reason for consult: Follow-up assessment;Infant weight loss;Term;Difficult latch  Baby is 1562 hours old.  6% weight loss  Baby awake and hungry , 2 wet diapers changed by LC.  LC resized NS , and noted the 316 NS to be to small, #20 to fit well , ( LC questioned whether  It was going to be no small as the baby sucked) . Sized for #24 NS after the baby finished and  The #24 NS was to big.  Baby stayed on long enough to suck down the appetizer and got fussy.  LC finished the feeding showing parents how to PACE feed with a bottle.  Baby took 25 ml well.  Mom aware to post pump both breast until the milk comes in and the baby is consistent  With latching.  LC reviewed the LC plan  Shells between feedings due to semi flat nipples  Prior to latch - breast massage , hand express, pre-pump with hand pump , apply NS and instill  Appetizer , and feed with firm support.  Post pump - save milk for next feeding.  Sore nipple and engorgement prevention and tx reviewed.  Per mom has a hand pump and a DEBP at home.  [facility]  LC placed request in the clinic basket. Mother informed of post-discharge support and given phone number to the lactation department, including services for phone call assistance; out-patient appointments; and breastfeeding support group. List of other breastfeeding resources in the community given in the handout. Encouraged mother to call for problems or concerns related to breastfeeding.    Maternal Data Has patient been taught Hand Expression?: Yes  Feeding Feeding Type: Formula(LC showed mom and dad how to PACE feed ) Nipple Type: Slow - flow  LATCH Score Latch: Grasps breast easily, tongue down, lips flanged, rhythmical sucking.  Audible Swallowing: A few with stimulation  Type of Nipple: Everted at rest  and after stimulation  Comfort (Breast/Nipple): Soft / non-tender  Hold (Positioning): Assistance needed to correctly position infant at breast and maintain latch.  LATCH Score: 8  Interventions Interventions: Breast feeding basics reviewed  Lactation Tools Discussed/Used Tools: Shells;Pump Nipple shield size: 20;16;Other (comment);24(resized / #16 NS to small. #20 NS fit well, and #24 NS to big ) Shell Type: Inverted Breast pump type: Double-Electric Breast Pump;Manual WIC Program: Yes Pump Review: Setup, frequency, and cleaning;Milk Storage Initiated by:: LC reviewed /    Consult Status Consult Status: Follow-up Date: (LC placed a LC O/P request in the Clinic basket ) Follow-up type: Out-patient    Matilde SprangMargaret Ann Shondrika Hoque 11/19/2017, 11:47 AM

## 2017-11-19 NOTE — Discharge Summary (Signed)
Obstetric Discharge Summary Reason for Admission: onset of labor Prenatal Procedures: none Intrapartum Procedures: cesarean: low cervical, transverse Postpartum Procedures: none Complications-Operative and Postpartum: none Hemoglobin  Date Value Ref Range Status  11/17/2017 9.2 (L) 12.0 - 15.0 g/dL Final    Comment:    REPEATED TO VERIFY DELTA CHECK NOTED    Hemoglobin, fingerstick  Date Value Ref Range Status  07/27/2016 12.4 12.0 - 16.0 g/dL Final   HCT  Date Value Ref Range Status  11/17/2017 26.5 (L) 36.0 - 46.0 % Final    Physical Exam:  General: alert Lochia: appropriate Uterine Fundus: firm Incision: healing well DVT Evaluation: No evidence of DVT seen on physical exam.  Discharge Diagnoses: Term Pregnancy-delivered, LTCS for fetal intol to labor  Discharge Information: Date: 11/19/2017 Activity: pelvic rest Diet: routine Medications: PNV, Ibuprofen and Percocet Condition: stable Instructions: refer to practice specific booklet Discharge to: home Follow-up Information    [place] Schedule an appointment as soon as possible for a visit in 1 week(s).   Contact information: [address] [place] [address] [phone]           Newborn Data: Live born female  Birth Weight: 7 lb 7.1 oz (3375 g) APGAR: 9, 10  Newborn Delivery   Birth date/time:  11/16/2017 21:14:00 Delivery type:  C-Section, Low Transverse C-section categorization:  Primary     Home with mother.  Kristine Rivera Kristine ObeyM Kristine Rivera 11/19/2017, 8:32 AM

## 2017-11-20 ENCOUNTER — Encounter (HOSPITAL_COMMUNITY): Payer: Self-pay | Admitting: *Deleted

## 2017-11-20 NOTE — Op Note (Signed)
                    Signed              Cesarean Section Procedure Note   Kristine Rivera  11/15/2017 - 11/16/2017  Indications: arrest of dilation   Pre-operative Diagnosis: Arrest of Dilation.   Post-operative Diagnosis: Same   Surgeon: Moishe SpiceSurgeon(s) and Role:    Zelphia Cairo* Khrystyna Schwalm, MD - Primary   Assistants: none  Anesthesia: spinal   Procedure Details:  The patient was seen in the Holding Room. The risks, benefits, complications, treatment options, and expected outcomes were discussed with the patient. The patient concurred with the proposed plan, giving informed consent. identified as Kristine Rivera and the procedure verified as C-Section Delivery. A Time Out was held and the above information confirmed.  After induction of anesthesia, the patient was draped and prepped in the usual sterile manner. A transverse was made and carried down through the subcutaneous tissue to the fascia. Fascial incision was made and extended transversely. The fascia was separated from the underlying rectus tissue superiorly and inferiorly. The peritoneum was identified and entered. Peritoneal incision was extended longitudinally. The utero-vesical peritoneal reflection was incised transversely and the bladder flap was bluntly freed from the lower uterine segment. A low transverse uterine incision was made. Delivered from cephalic presentation was a viable female infant. Cord ph was not sent the umbilical cord was clamped and cut cord blood was obtained for evaluation. The placenta was removed Intact and appeared normal. The uterine outline, tubes and ovaries appeared normal}. The uterine incision was closed with running locked sutures of 0chromic gut.  Extension of uterine incision down left repaired with 0 chromic and hemostatic. Hemostasis was observed. Lavage was carried out until clear. Peritoneum closed with monocryl. The fascia was then reapproximated with running sutures of 0PDS.   The skin was closed with 4-0Vicryl.   Instrument, sponge, and needle counts were correct prior the abdominal closure and were correct at the conclusion of the case.    Findings:   Estimated Blood Loss: 900 mL   Urine Output: clear  Complications: no complications  Disposition: PACU - hemodynamically stable.   Maternal Condition: stable   Baby condition / location:  In OR warmer with father & interpreter by side  Attending Attestation: I was present and scrubbed for the entire procedure.   Signed: Surgeon(s): Zelphia CairoAdkins, Macalister Arnaud, MD

## 2018-01-24 ENCOUNTER — Ambulatory Visit: Payer: 59 | Admitting: Obstetrics and Gynecology

## 2018-10-04 ENCOUNTER — Emergency Department (INDEPENDENT_AMBULATORY_CARE_PROVIDER_SITE_OTHER)
Admission: EM | Admit: 2018-10-04 | Discharge: 2018-10-04 | Disposition: A | Discharge status: Home / Self Care | Payer: 59 | Source: Home / Self Care | Attending: Family Medicine | Admitting: Family Medicine

## 2018-10-04 ENCOUNTER — Other Ambulatory Visit: Payer: Self-pay

## 2018-10-04 ENCOUNTER — Encounter: Payer: Self-pay | Admitting: Emergency Medicine

## 2018-10-04 DIAGNOSIS — J209 Acute bronchitis, unspecified: Secondary | ICD-10-CM | POA: Diagnosis not present

## 2018-10-04 MED ORDER — GUAIFENESIN-CODEINE 100-10 MG/5ML PO SOLN: ORAL | 0 refills | Status: AC

## 2018-10-04 MED ORDER — PREDNISONE 20 MG PO TABS: ORAL_TABLET | ORAL | 0 refills | Status: AC

## 2018-10-04 MED ORDER — AZITHROMYCIN 250 MG PO TABS: ORAL_TABLET | ORAL | 0 refills | Status: AC

## 2018-10-04 NOTE — ED Triage Notes (Signed)
Dry cough x2 weeks 

## 2018-10-04 NOTE — ED Provider Notes (Signed)
[facility]    CSN: [id number] Arrival date & time: 10/04/18  1807     History   Chief Complaint Chief Complaint  Patient presents with  . Cough    HPI Kristine Rivera is a 27 y.o. female.   Patient developed a URI 2+ weeks ago, and now has a persistent non-productive cough.  She sometimes coughs until she gags.  No shortness of breath or pleuritic pain.  No fever but she has felt hot at times. She does not smoke.  The history is provided by the patient.    Past Medical History:  Diagnosis Date  . Adhesions involving left fallopian tube    adhesions suggested by hysterosalpingogram Oct. 2017  . Bell's palsy    3-4 years ago    Patient Active Problem List   Diagnosis Date Noted  . S/P cesarean section 11/17/2017  . PROM (premature rupture of membranes) 11/15/2017    Past Surgical History:  Procedure Laterality Date  . CESAREAN SECTION N/A 11/16/2017   Procedure: CESAREAN SECTION;  Surgeon: Zelphia Cairo, MD;  Location: [facility] BIR[facility]ING [facility];  Service: Obstetrics;  Laterality: N/A;  . WISDOM TOOTH EXTRACTION  04/2016  . WISDOM TOOTH EXTRACTION     summer 2017    OB History    Gravida  1   Para  1   Term  1   Preterm  0   AB  0   Living  1     SAB  0   TAB  0   Ectopic  0   Multiple  0   Live Births  1            Home Medications    Prior to Admission medications   Medication Sig Start Date End Date Taking? Authorizing Provider  azithromycin (ZITHROMAX Z-PAK) 250 MG tablet Take 2 tabs today; then begin one tab once daily for 4 more days. 10/04/18   Lattie Haw, MD  guaiFENesin-codeine 100-10 MG/5ML syrup Take 10mL by mouth at bedtime as needed for cough.  May repeat dose in 4 to 6 hours. 10/04/18   Lattie Haw, MD  ibuprofen (ADVIL,MOTRIN) 600 MG tablet Take 1 tablet (600 mg total) by mouth every 6 (six) hours. 11/19/17   Richarda Overlie, MD  predniSONE (DELTASONE) 20 MG tablet Take one tab by mouth twice daily for  4 days, then one daily. Take with food. 10/04/18   Lattie Haw, MD  Prenatal Vit-Fe Fumarate-FA (PRENATAL VITAMIN PO) Take 1 tablet by mouth daily.    [provider]    Family History Family History  Problem Relation Age of Onset  . Hypertension Father   . Cancer Maternal Grandmother        stomach    Social History Social History   Tobacco Use  . Smoking status: Never Smoker  . Smokeless tobacco: Never Used  Substance Use Topics  . Alcohol use: Yes    Alcohol/week: 1.0 standard drinks    Types: 1 Standard drinks or equivalent per week    Comment: not during pregnancy  . Drug use: No     Allergies   Patient has no known allergies.   Review of Systems Review of Systems No sore throat + cough No pleuritic pain No wheezing No nasal congestion No post-nasal drainage No sinus pain/pressure No itchy/red eyes No earache No hemoptysis No SOB No fever, ? chills No nausea No vomiting No abdominal pain No diarrhea No urinary symptoms No skin rash +  fatigue No myalgias No headache Used OTC meds without relief   Physical Exam Triage Vital Signs ED Triage Vitals  Enc Vitals Group     BP 10/04/18 1915 122/80     Pulse Rate 10/04/18 1915 91     Resp --      Temp 10/04/18 1915 98.1 F (36.7 C)     Temp Source 10/04/18 1915 Oral     SpO2 10/04/18 1915 97 %     Weight 10/04/18 1916 207 lb (93.9 kg)     Height 10/04/18 1916 4\' 11"  (1.499 m)     Head Circumference --      Peak Flow --      Pain Score 10/04/18 1916 0     Pain Loc --      Pain Edu? --      Excl. in GC? --    No data found.  Updated Vital Signs BP 122/80 (BP Location: Right Arm)   Pulse 91   Temp 98.1 F (36.7 C) (Oral)   Ht 4\' 11"  (1.499 m)   Wt 93.9 kg   LMP 09/29/2018 (Exact Date)   SpO2 97%   Breastfeeding? No   BMI 41.81 kg/m   Visual Acuity Right Eye Distance:   Left Eye Distance:   Bilateral Distance:    Right Eye Near:   Left Eye Near:    Bilateral  Near:     Physical Exam Nursing notes and Vital Signs reviewed. Appearance:  Patient appears stated age, and in no acute distress Eyes:  Pupils are equal, round, and reactive to light and accomodation.  Extraocular movement is intact.  Conjunctivae are not inflamed  Ears:  Canals normal.  Tympanic membranes normal.  Nose:  Mildly congested turbinates.  No sinus tenderness.   Pharynx:  Normal Neck:  Supple.  Enlarged posterior/lateral nodes are palpated bilaterally, tender to palpation on the left.   Lungs:  Clear to auscultation.  Breath sounds are equal.  Moving air well. Heart:  Regular rate and rhythm without murmurs, rubs, or gallops.  Abdomen:  Nontender without masses or hepatosplenomegaly.  Bowel sounds are present.  No CVA or flank tenderness.  Extremities:  No edema.  Skin:  No rash present.    UC Treatments / Results  Labs (all labs ordered are listed, but only abnormal results are displayed) Labs Reviewed - No data to display  EKG None  Radiology No results found.  Procedures Procedures (including critical care time)  Medications Ordered in UC Medications - No data to display  Initial Impression / Assessment and Plan / UC Course  I have reviewed the triage vital signs and the nursing notes.  Pertinent labs & imaging results that were available during my care of the patient were reviewed by me and considered in my medical decision making (see chart for details).    Begin Z-pak for atypical organisms, and prednisone burst/taper. Rx for Robitussin AC for night time cough.  Controlled Substance Prescriptions I have consulted the [place] Controlled Substances Registry for this patient, and feel the risk/benefit ratio today is favorable for proceeding with this prescription for a controlled substance.   Followup with Family Doctor if not improved in one week.    Final Clinical Impressions(s) / UC Diagnoses   Final diagnoses:  Acute bronchitis, unspecified organism      Discharge Instructions     Take plain guaifenesin (1200mg  extended release tabs such as Mucinex) twice daily, with plenty of water, for cough and congestion.  Get adequate rest.   Try warm salt water gargles for sore throat.  Stop all antihistamines for now, and other non-prescription cough/cold preparations.      ED Prescriptions    Medication Sig Dispense Auth. Provider   azithromycin (ZITHROMAX Z-PAK) 250 MG tablet Take 2 tabs today; then begin one tab once daily for 4 more days. 6 tablet Lattie HawBeese,  A, MD   predniSONE (DELTASONE) 20 MG tablet Take one tab by mouth twice daily for 4 days, then one daily. Take with food. 12 tablet Lattie HawBeese,  A, MD   guaiFENesin-codeine 100-10 MG/5ML syrup Take 10mL by mouth at bedtime as needed for cough.  May repeat dose in 4 to 6 hours. 100 mL Lattie HawBeese,  A, MD         Lattie HawBeese,  A, MD 10/09/18 [phone]

## 2018-10-04 NOTE — Discharge Instructions (Addendum)
Take plain guaifenesin (1200mg extended release tabs such as Mucinex) twice daily, with plenty of water, for cough and congestion.  Get adequate rest.   °Try warm salt water gargles for sore throat.  °Stop all antihistamines for now, and other non-prescription cough/cold preparations. °  °
